# Patient Record
Sex: Female | Born: 1945 | Hispanic: Yes | Marital: Married | Smoking: Never smoker
Health system: Southern US, Community
[De-identification: ages and names within clinical notes are randomized; demographics above are authoritative.]

## PROBLEM LIST (undated history)

## (undated) DIAGNOSIS — IMO0002 Reserved for concepts with insufficient information to code with codable children: Secondary | ICD-10-CM

## (undated) DIAGNOSIS — I209 Angina pectoris, unspecified: Secondary | ICD-10-CM

## (undated) DIAGNOSIS — E119 Type 2 diabetes mellitus without complications: Secondary | ICD-10-CM

## (undated) DIAGNOSIS — Z7901 Long term (current) use of anticoagulants: Secondary | ICD-10-CM

## (undated) DIAGNOSIS — U071 COVID-19: Secondary | ICD-10-CM

## (undated) DIAGNOSIS — R112 Nausea with vomiting, unspecified: Secondary | ICD-10-CM

## (undated) DIAGNOSIS — I480 Paroxysmal atrial fibrillation: Secondary | ICD-10-CM

## (undated) DIAGNOSIS — I2510 Atherosclerotic heart disease of native coronary artery without angina pectoris: Secondary | ICD-10-CM

## (undated) DIAGNOSIS — M810 Age-related osteoporosis without current pathological fracture: Secondary | ICD-10-CM

## (undated) DIAGNOSIS — J45909 Unspecified asthma, uncomplicated: Secondary | ICD-10-CM

## (undated) DIAGNOSIS — Z9889 Other specified postprocedural states: Secondary | ICD-10-CM

## (undated) DIAGNOSIS — Z9989 Dependence on other enabling machines and devices: Secondary | ICD-10-CM

## (undated) DIAGNOSIS — E785 Hyperlipidemia, unspecified: Secondary | ICD-10-CM

## (undated) DIAGNOSIS — I10 Essential (primary) hypertension: Secondary | ICD-10-CM

## (undated) DIAGNOSIS — Z79899 Other long term (current) drug therapy: Secondary | ICD-10-CM

## (undated) DIAGNOSIS — I4891 Unspecified atrial fibrillation: Secondary | ICD-10-CM

## (undated) DIAGNOSIS — M329 Systemic lupus erythematosus, unspecified: Secondary | ICD-10-CM

## (undated) DIAGNOSIS — Z972 Presence of dental prosthetic device (complete) (partial): Secondary | ICD-10-CM

## (undated) DIAGNOSIS — E039 Hypothyroidism, unspecified: Secondary | ICD-10-CM

## (undated) DIAGNOSIS — K219 Gastro-esophageal reflux disease without esophagitis: Secondary | ICD-10-CM

## (undated) DIAGNOSIS — Z7960 Long term (current) use of unspecified immunomodulators and immunosuppressants: Secondary | ICD-10-CM

## (undated) DIAGNOSIS — I5189 Other ill-defined heart diseases: Secondary | ICD-10-CM

## (undated) HISTORY — PX: SALPINGOOPHORECTOMY: SHX82

## (undated) HISTORY — PX: CHOLECYSTECTOMY: SHX55

## (undated) HISTORY — PX: SPLENECTOMY, TOTAL: SHX788

---

## 2009-07-16 ENCOUNTER — Emergency Department: Payer: Self-pay | Admitting: Emergency Medicine

## 2010-08-12 ENCOUNTER — Emergency Department: Payer: Self-pay | Admitting: Emergency Medicine

## 2011-10-28 IMAGING — CT CT HEAD WITHOUT CONTRAST
2 series · 15 of 30 positions shown, 19 images · non-contrast
Comparison: none

REASON FOR EXAM: headache, HTN
COMMENTS:

[Series 2: without · axial · non-contrast · 0.38mm/px · z∈[+1057,+1187]mm · 13 of 32 slices shown, 17 images]
[im 3/32  brain]
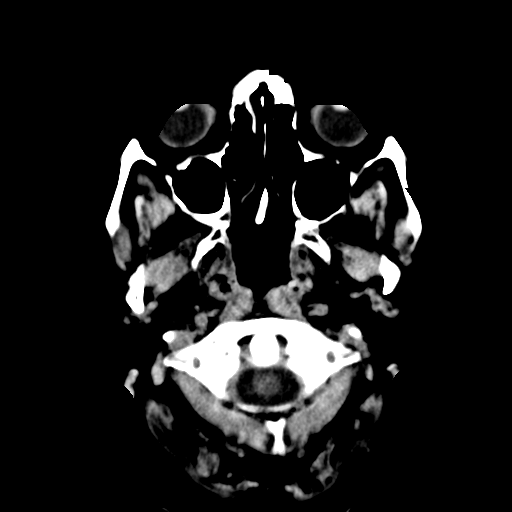
[im 3/32  bone]
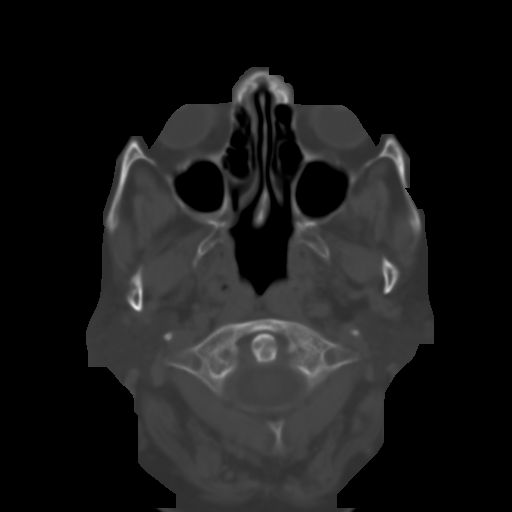
[im 5/32  brain]
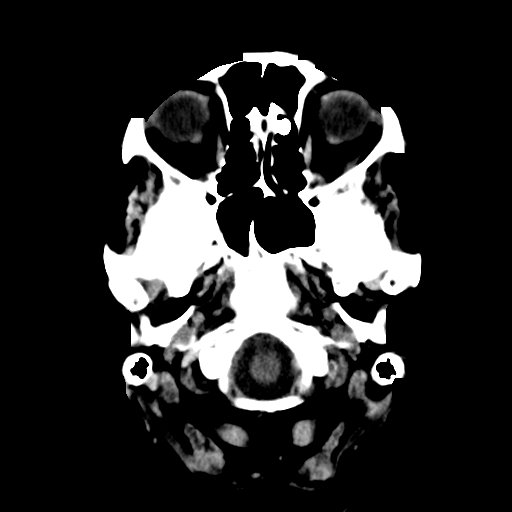
[im 7/32  brain]
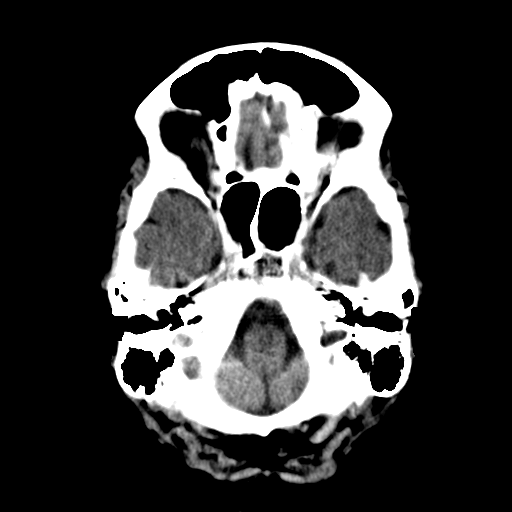
[im 9/32  brain]
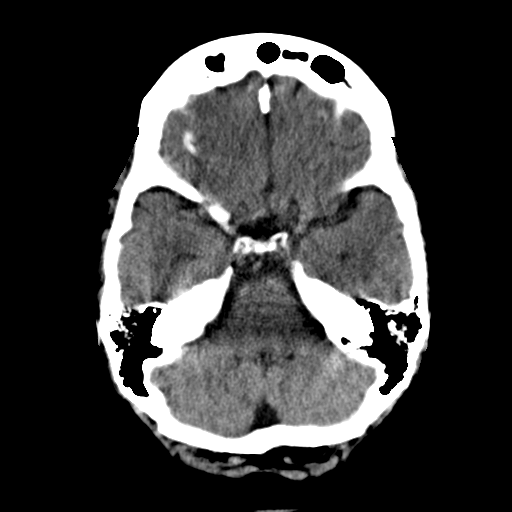
[im 12/32  brain]
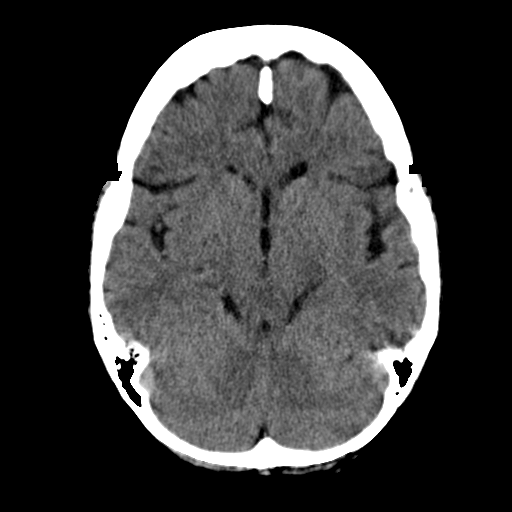
[im 12/32  bone]
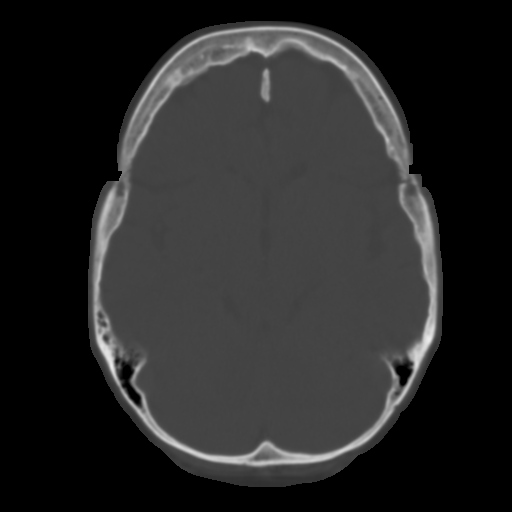
[im 14/32  brain]
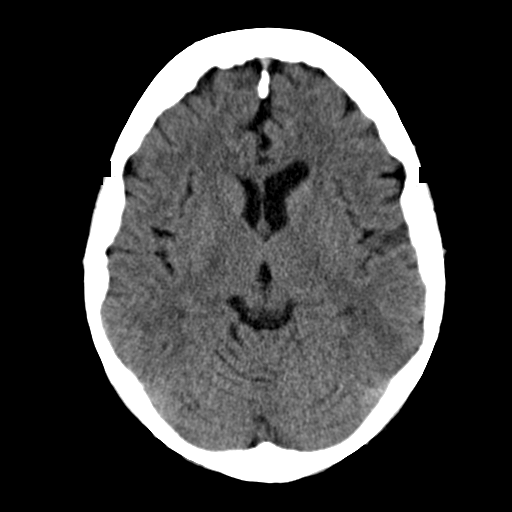
[im 16/32  brain]
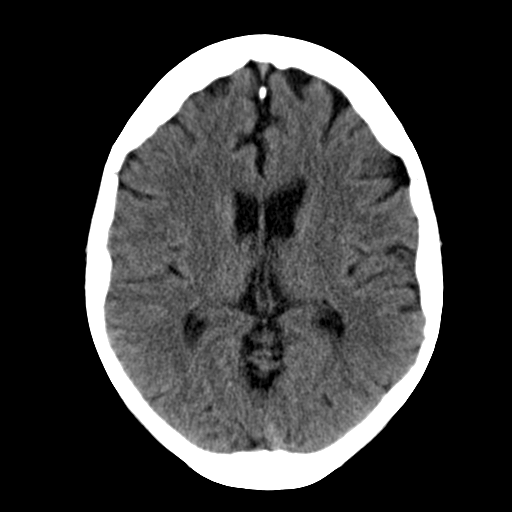
[im 18/32  brain]
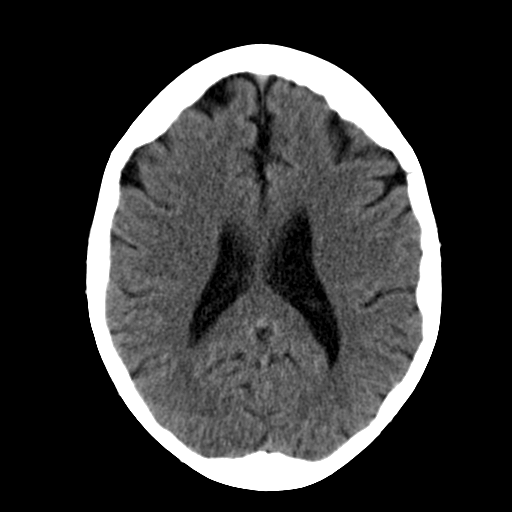
[im 20/32  brain]
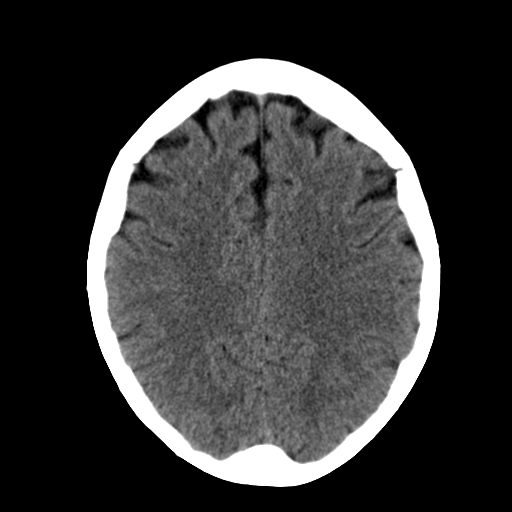
[im 20/32  bone]
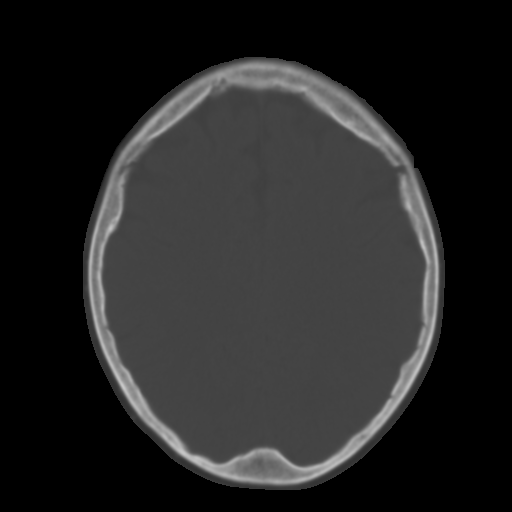
[im 23/32  brain]
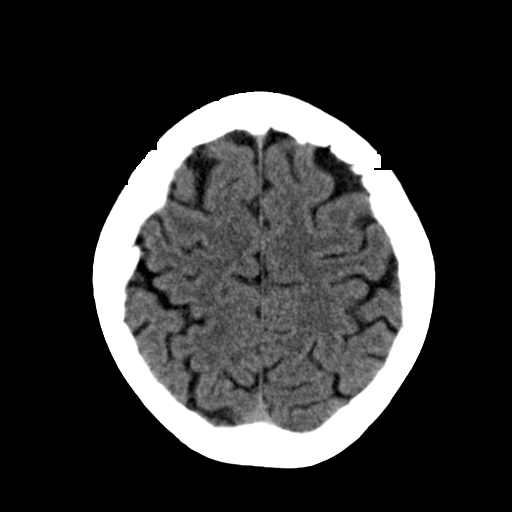
[im 25/32  brain]
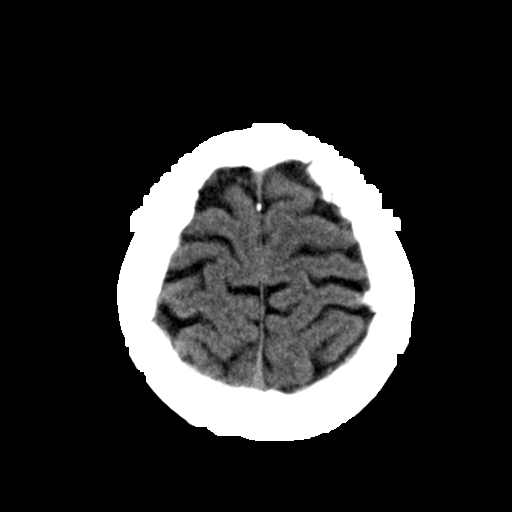
[im 27/32  brain]
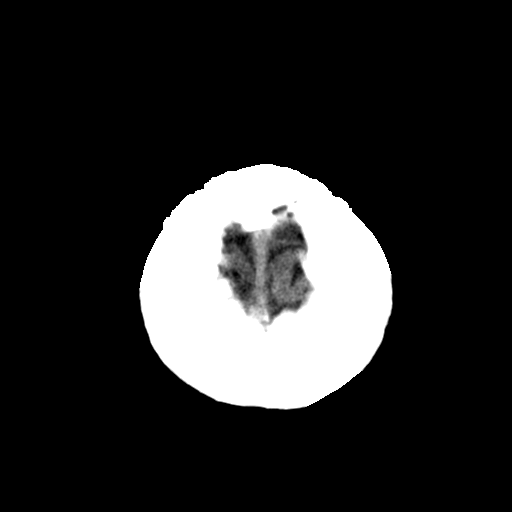
[im 29/32  brain]
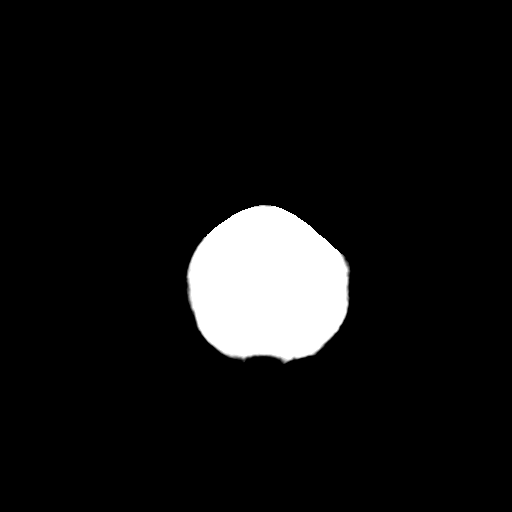
[im 29/32  bone]
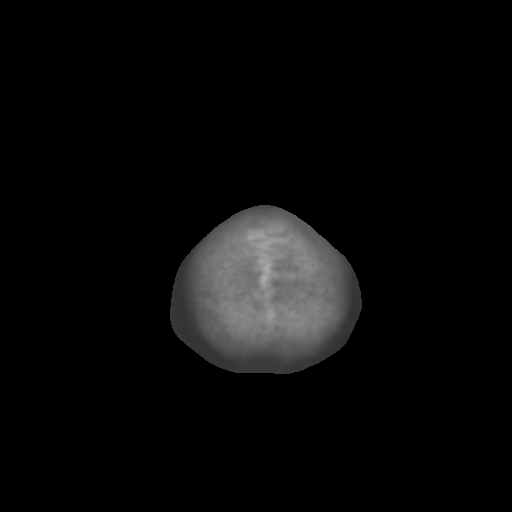

[Series 3: bone · axial · 0.38mm/px · z∈[+1057,+1077]mm · 2 of 32 slices shown]
[im 3/32  bone]
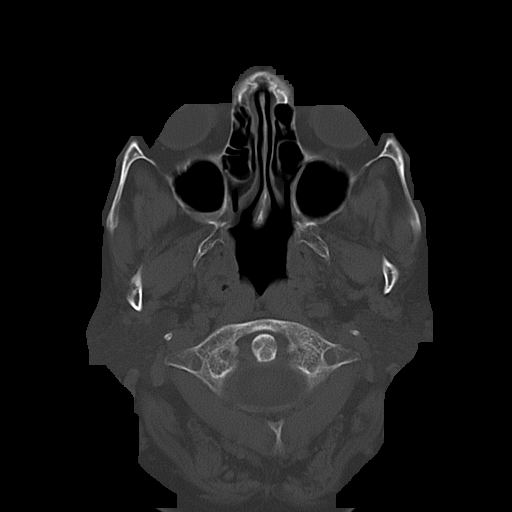
[im 7/32  bone]
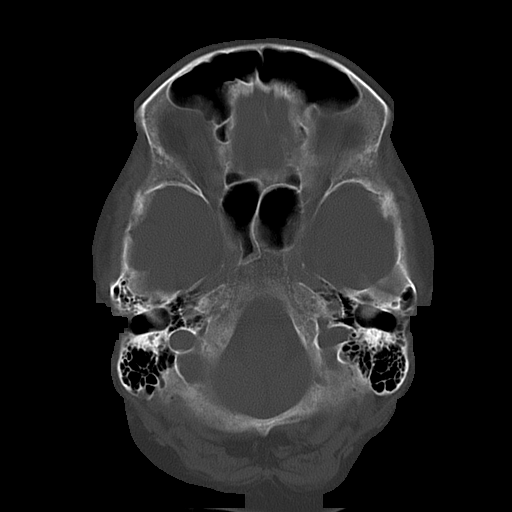

[15 of 30 positions shown; findings below may reference images not displayed]

PROCEDURE:     CT  - CT HEAD WITHOUT CONTRAST  - August 12, 2010  [DATE]

RESULT:     Axial noncontrast CT scanning was performed through the brain at
5 mm intervals and slice thicknesses.

There is mild age-appropriate diffuse cerebral atrophy. The ventricles are
normal in size and position. There is no intracranial hemorrhage nor
intracranial mass effect. There is no evidence of an evolving ischemic
infarction. The cerebellum and brainstem are normal in density.

At bone window settings there is an air-fluid level in the right maxillary
sinus. In the absence of a history of trauma, infection is felt to be
likely. I see no evidence of an acute skull fracture.
IMPRESSION: 1. I see no acute intracranial hemorrhage nor other acute abnormality of the
brain.
2. There is an air-fluid level in the right maxillary sinus which likely
reflects acute sinusitis in the absence of any history of trauma.

## 2012-05-24 HISTORY — PX: CARDIAC CATHETERIZATION: SHX172

## 2020-02-28 ENCOUNTER — Other Ambulatory Visit: Payer: Self-pay | Admitting: Family Medicine

## 2020-02-28 DIAGNOSIS — Z1231 Encounter for screening mammogram for malignant neoplasm of breast: Secondary | ICD-10-CM

## 2020-03-02 ENCOUNTER — Ambulatory Visit: Payer: Medicare HMO | Admitting: Dermatology

## 2020-03-02 ENCOUNTER — Other Ambulatory Visit: Payer: Self-pay

## 2020-03-02 DIAGNOSIS — B353 Tinea pedis: Secondary | ICD-10-CM | POA: Diagnosis not present

## 2020-03-02 DIAGNOSIS — L820 Inflamed seborrheic keratosis: Secondary | ICD-10-CM | POA: Diagnosis not present

## 2020-03-02 MED ORDER — TERBINAFINE HCL 250 MG PO TABS: 250.0000 mg | ORAL_TABLET | Freq: Every day | ORAL | 0 refills | Status: AC

## 2020-03-02 MED ORDER — CICLOPIROX OLAMINE 0.77 % EX CREA
TOPICAL_CREAM | CUTANEOUS | 1 refills | Status: DC
Start: 2020-03-02 — End: 2021-05-11

## 2020-03-02 NOTE — Progress Notes (Signed)
° °  New Patient Visit  Subjective  Janice Cook is a 74 y.o. female who presents for the following: rash (feet x 6 mos, arms - sometimes itchy and growing). She is using Lubriderm moisturizer, which helps some.  She also has a spot on her arm which is irritated and itchy.   The following portions of the chart were reviewed this encounter and updated as appropriate:      Review of Systems:  No other skin or systemic complaints except as noted in HPI or Assessment and Plan.  Objective  Well appearing patient in no apparent distress; mood and affect are within normal limits.  A focused examination was performed including feet, arms. Relevant physical exam findings are noted in the Assessment and Plan.  Objective  Plantar feet, toenails: Erythema with scale on plantar feet and webspaces with associated toenail dystrophy.  Objective  Right Forearm: Erythematous keratotic or waxy stuck-on papule    Assessment & Plan  Tinea pedis of both feet Plantar feet, toenails  Start terbinafine 250mg  take 1 po QD with food dsp #14 0Rf. Start ciclopirox cream Apply to feet and webspaces BID x 4 weeks, then use QD around toenails.  Terbinafine Counseling  Terbinafine is an anti-fungal medicine that can be applied to the skin (over the counter) or taken by mouth (prescription) to treat fungal infections. The pill version is often used to treat fungal infections of the nails or scalp. While most people do not have any side effects from taking terbinafine pills, some possible side effects of the medicine can include taste changes, headache, loss of smell, vision changes, nausea, vomiting, or diarrhea.   Rare side effects can include irritation of the liver, allergic reaction, or decrease in blood counts (which may show up as not feeling well or developing an infection). If you are concerned about any of these side effects, please stop the medicine and call your doctor, or in the case of an  emergency such as feeling very unwell, seek immediate medical care.    ciclopirox (LOPROX) 0.77 % cream - Plantar feet, toenails  terbinafine (LAMISIL) 250 MG tablet - Plantar feet, toenails  Inflamed seborrheic keratosis Right Forearm  Discussed LN2 tx vs topical hydrocortisone cream. Pt prefers LN2. Discussed risk of hypopigmentation at treatment site.  Destruction of lesion - Right Forearm  Destruction method: cryotherapy   Informed consent: discussed and consent obtained   Lesion destroyed using liquid nitrogen: Yes   Region frozen until ice ball extended beyond lesion: Yes   Outcome: patient tolerated procedure well with no complications   Post-procedure details: wound care instructions given    Return in about 6 weeks (around 04/13/2020) for recheck ISK, tinea.  I10/10/2019, CMA, am acting as scribe for Cherlyn Labella, MD .  Documentation: I have reviewed the above documentation for accuracy and completeness, and I agree with the above.  Willeen Niece MD

## 2020-03-02 NOTE — Patient Instructions (Addendum)
Cryotherapy Aftercare  . Wash gently with soap and water everyday.   Marland Kitchen Apply Vaseline and Band-Aid daily until healed.   Terbinafine Counseling  Terbinafine is an anti-fungal medicine that can be applied to the skin (over the counter) or taken by mouth (prescription) to treat fungal infections. The pill version is often used to treat fungal infections of the nails or scalp. While most people do not have any side effects from taking terbinafine pills, some possible side effects of the medicine can include taste changes, headache, loss of smell, vision changes, nausea, vomiting, or diarrhea.   Rare side effects can include irritation of the liver, allergic reaction, or decrease in blood counts (which may show up as not feeling well or developing an infection). If you are concerned about any of these side effects, please stop the medicine and call your doctor, or in the case of an emergency such as feeling very unwell, seek immediate medical care.

## 2020-03-10 ENCOUNTER — Ambulatory Visit
Admission: RE | Admit: 2020-03-10 | Discharge: 2020-03-10 | Disposition: A | Discharge status: Home / Self Care | Payer: Medicare HMO | Source: Ambulatory Visit | Attending: Family Medicine | Admitting: Family Medicine

## 2020-03-10 ENCOUNTER — Encounter: Payer: Self-pay | Admitting: Radiology

## 2020-03-10 DIAGNOSIS — Z1231 Encounter for screening mammogram for malignant neoplasm of breast: Secondary | ICD-10-CM

## 2020-03-11 ENCOUNTER — Other Ambulatory Visit (HOSPITAL_COMMUNITY): Payer: Self-pay | Admitting: Student

## 2020-03-11 ENCOUNTER — Other Ambulatory Visit: Payer: Self-pay | Admitting: Student

## 2020-03-11 DIAGNOSIS — M7541 Impingement syndrome of right shoulder: Secondary | ICD-10-CM

## 2020-03-11 DIAGNOSIS — M7581 Other shoulder lesions, right shoulder: Secondary | ICD-10-CM

## 2020-03-30 ENCOUNTER — Ambulatory Visit: Payer: Medicare HMO

## 2020-04-13 ENCOUNTER — Ambulatory Visit
Admission: RE | Admit: 2020-04-13 | Discharge: 2020-04-13 | Disposition: A | Discharge status: Home / Self Care | Payer: Medicare HMO | Source: Ambulatory Visit | Attending: Student | Admitting: Student

## 2020-04-13 ENCOUNTER — Other Ambulatory Visit: Payer: Self-pay

## 2020-04-13 DIAGNOSIS — M7581 Other shoulder lesions, right shoulder: Secondary | ICD-10-CM | POA: Diagnosis present

## 2020-04-13 DIAGNOSIS — M7541 Impingement syndrome of right shoulder: Secondary | ICD-10-CM | POA: Diagnosis present

## 2020-04-28 ENCOUNTER — Encounter: Payer: Self-pay | Admitting: Dermatology

## 2020-04-28 ENCOUNTER — Ambulatory Visit: Payer: Medicare HMO | Admitting: Dermatology

## 2020-04-28 ENCOUNTER — Other Ambulatory Visit: Payer: Self-pay

## 2020-04-28 DIAGNOSIS — B353 Tinea pedis: Secondary | ICD-10-CM | POA: Diagnosis not present

## 2020-04-28 DIAGNOSIS — L820 Inflamed seborrheic keratosis: Secondary | ICD-10-CM | POA: Diagnosis not present

## 2020-04-28 NOTE — Patient Instructions (Addendum)
Recommend daily broad spectrum sunscreen SPF 30+ to sun-exposed areas, reapply every 2 hours as needed. Call for new or changing lesions.  Can purchase the Terbinafine cream over the counter called Lamisil cream in the pharmacy apply to feet daily especially around toe nails

## 2020-04-28 NOTE — Progress Notes (Signed)
   Follow-Up Visit   Subjective  Janice Cook is a 74 y.o. female who presents for the following: Follow-up.  Patient presents today for follow up on OV 03/02/20 for Tinea Pedis and ISK. Has been taking Terbinafine 250 mg QD and ciclopirox cream BID for tinea pedis, and cryotherapy for isk R. Forearm. Patient states that ISK is better if not resolved and feet are a lot better at this time. No longer red and scaly or itchy   The following portions of the chart were reviewed this encounter and updated as appropriate:      Review of Systems:  No other skin or systemic complaints except as noted in HPI or Assessment and Plan.  Objective  Well appearing patient in no apparent distress; mood and affect are within normal limits.  A focused examination was performed including Feet. Relevant physical exam findings are noted in the Assessment and Plan.  Objective  Right Forearm - Anterior: Dyspigmented macule- no residual ISK  Objective  Right Foot - Anterior: Mild erythema, no scale, web spaces clear.   Assessment & Plan  Inflamed seborrheic keratosis Right Forearm - Anterior  Resolved with PIPA Recommend daily broad spectrum sunscreen SPF 30+ to sun-exposed areas, reapply every 2 hours as needed. Call for new or changing lesions.   Tinea pedis of both feet Right Foot - Anterior  Resolved Continue using Ciclopirox cream QD daily around toenails for prevention of recurrence May also use OTC Lamisil cream  ciclopirox (LOPROX) 0.77 % cream - Right Foot - Anterior  Return if symptoms worsen or fail to improve.  Documentation: I have reviewed the above documentation for accuracy and completeness, and I agree with the above.  Willeen Niece MD

## 2021-01-29 ENCOUNTER — Other Ambulatory Visit: Payer: Self-pay | Admitting: Student

## 2021-01-29 DIAGNOSIS — M7581 Other shoulder lesions, right shoulder: Secondary | ICD-10-CM

## 2021-01-29 DIAGNOSIS — M7521 Bicipital tendinitis, right shoulder: Secondary | ICD-10-CM

## 2021-01-29 DIAGNOSIS — M75121 Complete rotator cuff tear or rupture of right shoulder, not specified as traumatic: Secondary | ICD-10-CM

## 2021-03-09 ENCOUNTER — Ambulatory Visit
Admission: RE | Admit: 2021-03-09 | Discharge: 2021-03-09 | Disposition: A | Discharge status: Home / Self Care | Payer: Medicare HMO | Source: Ambulatory Visit | Attending: Student | Admitting: Student

## 2021-03-09 ENCOUNTER — Other Ambulatory Visit: Payer: Self-pay

## 2021-03-09 DIAGNOSIS — M7581 Other shoulder lesions, right shoulder: Secondary | ICD-10-CM | POA: Diagnosis present

## 2021-03-09 DIAGNOSIS — M75121 Complete rotator cuff tear or rupture of right shoulder, not specified as traumatic: Secondary | ICD-10-CM

## 2021-03-09 DIAGNOSIS — M7521 Bicipital tendinitis, right shoulder: Secondary | ICD-10-CM | POA: Insufficient documentation

## 2021-04-20 ENCOUNTER — Other Ambulatory Visit: Payer: Self-pay | Admitting: Surgery

## 2021-04-20 DIAGNOSIS — Z8616 Personal history of COVID-19: Secondary | ICD-10-CM

## 2021-04-20 HISTORY — DX: Personal history of COVID-19: Z86.16

## 2021-04-30 ENCOUNTER — Other Ambulatory Visit: Payer: Self-pay

## 2021-04-30 ENCOUNTER — Encounter
Admission: RE | Admit: 2021-04-30 | Discharge: 2021-04-30 | Disposition: A | Discharge status: Home / Self Care | Payer: Medicare HMO | Source: Ambulatory Visit | Attending: Surgery | Admitting: Surgery

## 2021-04-30 HISTORY — DX: COVID-19: U07.1

## 2021-04-30 HISTORY — DX: Type 2 diabetes mellitus without complications: E11.9

## 2021-04-30 HISTORY — DX: Unspecified atrial fibrillation: I48.91

## 2021-04-30 HISTORY — DX: Hyperlipidemia, unspecified: E78.5

## 2021-04-30 HISTORY — DX: Nausea with vomiting, unspecified: R11.2

## 2021-04-30 HISTORY — DX: Unspecified asthma, uncomplicated: J45.909

## 2021-04-30 HISTORY — DX: Reserved for concepts with insufficient information to code with codable children: IMO0002

## 2021-04-30 HISTORY — DX: Systemic lupus erythematosus, unspecified: M32.9

## 2021-04-30 HISTORY — DX: Other specified postprocedural states: Z98.890

## 2021-04-30 HISTORY — DX: Essential (primary) hypertension: I10

## 2021-04-30 HISTORY — DX: Atherosclerotic heart disease of native coronary artery without angina pectoris: I25.10

## 2021-04-30 NOTE — Pre-Procedure Instructions (Signed)
Patient interviewed using interpreter "C" [phone].

## 2021-04-30 NOTE — Patient Instructions (Addendum)
Your procedure is scheduled on: 05/11/21 Report to DAY SURGERY DEPARTMENT LOCATED ON 2ND FLOOR MEDICAL MALL ENTRANCE. To find out your arrival time please call [phone] between 1PM - 3PM on 05/10/21.  Remember: Instructions that are not followed completely may result in serious medical risk, up to and including death, or upon the discretion of your surgeon and anesthesiologist your surgery may need to be rescheduled.     _X__ 1. Do not eat food after midnight the night before your procedure.                 No gum chewing or hard candies. You may drink clear liquids up to 2 hours                 before you are scheduled to arrive for your surgery-                  Diabetics water only  __X__2.  On the morning of surgery brush your teeth with toothpaste and water, you                 may rinse your mouth with mouthwash if you wish.  Do not swallow any              toothpaste of mouthwash.     _X__ 3.  No Alcohol for 24 hours before or after surgery.   _X__ 4.  Do Not Smoke or use e-cigarettes For 24 Hours Prior to Your Surgery.                 Do not use any chewable tobacco products for at least 6 hours prior to                 surgery.  ____  5.  Bring all medications with you on the day of surgery if instructed.   __X__  6.  Notify your doctor if there is any change in your medical condition      (cold, fever, infections).     Do not wear jewelry, make-up, hairpins, clips or nail polish. Do not wear lotions, powders, or perfumes. NO DEODORANT Do not shave body hair 48 hours prior to surgery. Men may shave face and neck. Do not bring valuables to the hospital.  Wear loose fitting cloting  [facility] is not responsible for any belongings or valuables.  Contacts, dentures/partials or body piercings may not be worn into surgery. Bring a case for your contacts, glasses or hearing aids, a denture cup will be supplied. Leave your suitcase in the car. After surgery it may be brought to  your room. For patients admitted to the hospital, discharge time is determined by your treatment team.   Patients discharged the day of surgery will not be allowed to drive home.   Please read over the following fact sheets that you were given:   CHG soap, Incentive Spirometer  __X__ Take these medicines the morning of surgery with A SIP OF WATER:    1. amiodarone (PACERONE) 200 MG tablet  2. DULoxetine (CYMBALTA) 20 MG capsule  3. fenofibrate 160 MG tablet  4. gabapentin (NEURONTIN) 300 MG capsule  5. omeprazole (PRILOSEC) 20 MG capsule  6. predniSONE (DELTASONE) 1 MG tablet  7. rosuvastatin (CRESTOR) 40 MG tablet  8. mycophenolate (CELLCEPT) 250 MG capsule  ____ Fleet Enema (as directed)   __X__ Use CHG Soap/SAGE wipes as directed  ____ Use inhalers on the day of surgery  ____ Stop  metformin/Janumet/Farxiga 2 days prior to surgery    __X__ Take 1/2 of usual insulin dose the night before surgery. No insulin the morning          of surgery.   __X__ Stop Blood Thinners Eliquis 3 DAYS PRIOR TO SURGERY AS INSTRUCTED  __X__ Stop Anti-inflammatories 7 days before surgery such as Advil, Ibuprofen, Motrin,  BC or Goodies Powder, Naprosyn, Naproxen, Aleve, Aspirin   YOU MAY TAKE TYLENOL IF NEEDED  __X__ Stop all herbal supplements, fish oil or vitamin E until after surgery.    ____ Bring C-Pap to the hospital.      Su procedimiento est programado para: 30/8/22 [place] en el DEPARTAMENTO DE CIRUGA DE DA UBICADO EN LA ENTRADA DEL CENTRO MDICO DEL 2. PISO. Para averiguar su hora de llegada, llame al [phone] entre la 1:00 p. m. y las 3:00 p. m. el 29/08/22.  Recuerde: las instrucciones que no se siguen por completo Armed forces logistics/support/administrative officer en un riesgo mdico grave, que puede incluir la [place], o segn el criterio de su cirujano y Scientific laboratory technician, es posible que sea Aeronautical engineer su Leisure centre manager.    _X__ 1. No ingiera alimentos despus de la medianoche anterior a su  procedimiento.                 No masticar chicle ni caramelos duros. Puede beber lquidos claros hasta 2 horas                 antes de su llegada programada para su ciruga-                 Slo agua para diabticos  __X__2. En la maana de la ciruga cepllese los dientes con pasta dental y agua, [place] enjuagarse la boca con enjuague bucal si lo desea. No trague ninguna pasta de dientes o enjuague bucal.   _X__ 3. Nada de alcohol por 24 horas antes o despus de la [place].   _X__ 4. No fume ni use cigarrillos electrnicos durante las 24 horas previas a su [place].                 No use ningn producto de tabaco masticable durante al menos 6 horas antes de                 [place].  ____ 5. Traiga todos los medicamentos con usted el da de la ciruga si se lo indicaron.  __X__ 6. Notifique a su mdico si hay algn cambio en su condicin mdica (resfriado, fiebre, infecciones).   No use joyas, maquillaje, horquillas para el cabello, clips o esmalte de uas. No use lociones, talcos ni perfumes. SIN DESODORANTE No se afeite el vello corporal 48 horas antes de la [place]. Los hombres pueden [employer] cara y el cuello. No lleve objetos de valor al hospital. Use ropa holgada  [facility] no es responsable de ninguna pertenencia u objeto de Licensed conveyancer.  No se pueden usar lentes de contacto, dentaduras postizas/parciales o perforaciones corporales en la ciruga. Traiga un estuche para sus lentes de contacto, anteojos o audfonos, se le proporcionar una copa para dentadura postiza. Deja tu maleta en el coche. Despus de la [place], es posible que lo lleven a su habitacin. Para los pacientes ingresados en el hospital, la hora del alta la determina su equipo de tratamiento  Los pacientes dados de alta el da de la ciruga no podrn Science writer a Higher education careers adviser.  Por favor, lea las siguientes hojas informativas que le dieron: Jabn CHG,  espirmetro de incentivo  __X__ [employer] la maana  de la ciruga con UN SORBO DE AGUA:  1. comprimido de 200 mg de amiodarona (PACERONE) 2. Cpsula de 20 mg de DULoxetina (CYMBALTA) 3. comprimido de fenofibrato de 160 mg 4. gabapentina (NEURONTIN) cpsula de 300 MG 5. cpsula de 20 mg de omeprazol (PRILOSEC) 6. tableta de 1 MG de predniSONA (DELTASONA) 7. tableta de 40 mg de rosuvastatina (CRESTOR) 8. micofenolato (CELLCEPT) cpsula de 250 MG  ____ Fleet Enema (segn las indicaciones)  __X__ Use CHG Soap/SAGE toallitas como se indica  ____ Usar inhaladores el da de la ciruga  ____ Suspender metformina/Janumet/Farxiga 2 das antes de la ciruga  __X__ [employer] mitad de la dosis habitual de insulina la noche anterior a la Leisure centre manager. Sin insulina por la maana          de [place]  __X__ Deje de diluir la sangre Eliquis 3 DAS ANTES DE LA CIRUGA SEGN LAS INSTRUCCIONES  __X__ Suspender los antiinflamatorios 7 das antes de la ciruga como Advil, Ibuprofen, Motrin, BC o Goodies Powder, Naprosyn, Naproxen, Aleve, Aspirin PUEDE TOMAR TYLENOL SI ES NECESARIO  __X__ Suspenda todos los suplementos de hierbas, aceite de pescado o vitamina E hasta despus de la [place].  ____ [facility] al hospital.

## 2021-05-06 ENCOUNTER — Encounter: Payer: Self-pay | Admitting: Surgery

## 2021-05-06 NOTE — Progress Notes (Signed)
Perioperative Services  Pre-Admission/Anesthesia Testing Clinical Review  Date: 05/06/21  Patient Demographics:  Name: Janice Cook DOB:   February 23, 1946 MRN:   [id number]  Planned Surgical Procedure(s):    Case: [id number] Date/Time: 05/11/21 1303   Procedure: SHOULDER ARTHROSCOPY WITH DEBRIDEMENT, DECOMPRESSION, ROTATOR CUFF REPAIR, BICEPS TENODESIS AND DISTAL CLAVICLE EXCISION. 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 Clinical nursing staff has updated patient's PMH/PSHx, current medication list, and drug allergies/intolerances to ensure comprehensive history available to assist in medical decision making as it pertains to the aforementioned surgical procedure and anticipated anesthetic course. Extensive review of available clinical information performed. [place] PMH and PSHx updated with any diagnoses/procedures that  may have been inadvertently omitted during her intake with the pre-admission testing department's nursing staff.  Clinical Discussion:  Janice Cook is a 75 y.o. female who is submitted for pre-surgical anesthesia review and clearance prior to her undergoing the above procedure. Patient has never been a smoker. Pertinent PMH includes: CAD, paroxysmal atrial fibrillation, diastolic dysfunction (S9QZ), angina, HTN, HLD, T2DM, asthma, GERD (on daily PPI), SLE, osteoporosis, long-term immunosuppressive therapy (MTX and mycophenolate).  Patient is followed by cardiology Nehemiah Massed, MD). She was last seen in  the cardiology clinic on 04/08/2021; notes reviewed.  At the time of her clinic visit, patient doing well overall from a cardiovascular perspective.  She denied any episodes of chest pain, shortness breath, PND, orthopnea, significant peripheral edema, vertiginous symptoms, or presyncope/syncope.  Patient with intermittent palpitations associated with her known underlying cardiovascular diagnoses.  Patient underwent a diagnostic left heart catheterization on 05/24/2012 that revealed a LEFT dominant coronary artery system.  LVEF was estimated at 60%.  There was no obstructive CAD noted.  LVEDP 16.  TTE was performed on 02/17/2020 revealing globally normal systolic heart function.  There was mild to moderate valvular insufficiency noted.  No evidence of valvular stenosis.  Repeat study on 10/07/2020 remain grossly unchanged.  Myocardial perfusion imaging study performed on 10/07/2020 demonstrated an LVEF of 58%.  There was no evidence of stress-induced myocardial ischemia or arrhythmia.  PMH significant for paroxysmal atrial fibrillation. CHA2DS2-VASc Score = 4 (age, sex, HTN, T2DM).  Patient is chronically anticoagulated with daily apixaban dose; compliant with therapy with no evidence of GI bleeding.  Rate and rhythm controlled with daily 200 mg dose of amiodarone.  Blood pressure well controlled at 120/78 on currently prescribed diuretic and ARB therapies.  Patient is on fenofibrate therapy for her HLD.  T2DM well controlled per patient report; no recent Hgb A1c available for review. Functional capacity, as defined by DASI, is documented as being >/= 4 METS.  No changes were made to her medication regimen.  Patient to follow-up with outpatient cardiology in 6 months or sooner if needed.  Patient is scheduled to undergo an elective RIGHT shoulder arthroscopy, rotator cuff repair, biceps tenodesis, and distal clavicle excision on 05/11/2021 with Dr. Milagros Evener, MD.  Given patient's past medical  history  significant for cardiovascular disease and intervention, presurgical cardiac clearance was sought by the performing surgeon's office and PAT team. "The patient is at the lowest risk possible for perioperative cardiovascular complications with the planned procedure.  The overall risk her procedure is low (<1%).  Currently has no evidence active and/or significant angina and/or congestive heart failure. Patient may proceed to surgery without restriction or need for further cardiovascular testing and an overall LOW risk".  Again, this patient is on daily anticoagulation therapy.  She has been instructed on recommendations for holding her apixaban for 3 days prior to her procedure with plans to restart as soon as postoperative bleeding risk felt to be minimized by the primary attending surgeon.  The patient is aware that her last dose of apixaban will be on 05/07/2021.  Patient denies previous perioperative complications with anesthesia in the past.  In review of the EMR, there are no available records for review regarding patient's past procedural/anesthetic courses within the [facility] system.  Vitals with BMI 04/30/2021  Height '4\' 9"'   Weight 149 lbs  BMI 32.23    Providers/Specialists:   NOTE: Primary physician provider listed below. Patient may have been seen by APP or partner within same practice.   PROVIDER ROLE / SPECIALTY LAST OV  Poggi, Marshall Cork, MD Orthopedics (Surgeon) 04/19/2021  Letta Median, MD Primary Care Provider ???  Serafina Royals, MD Cardiology 04/08/2021  Ephriam Jenkins, MD Rheumatology 03/18/2021   Allergies:  Aspirin, Nitroglycerin, Other, Sulfa antibiotics, Propofol, and Latex  Current Home Medications:   No current facility-administered medications for this encounter.    alendronate (FOSAMAX) 70 MG tablet   amiodarone (PACERONE) 200 MG tablet   apixaban (ELIQUIS) 5 MG TABS tablet   diclofenac Sodium (VOLTAREN) 1 % GEL   fenofibrate 160 MG tablet    fluticasone (FLONASE) 50 MCG/ACT nasal spray   folic acid (FOLVITE) 1 MG tablet   furosemide (LASIX) 20 MG tablet   gabapentin (NEURONTIN) 300 MG capsule   insulin glargine (LANTUS) 100 UNIT/ML Solostar Pen   meloxicam (MOBIC) 15 MG tablet   methotrexate (RHEUMATREX) 2.5 MG tablet   montelukast (SINGULAIR) 10 MG tablet   mycophenolate (CELLCEPT) 250 MG capsule   omeprazole (PRILOSEC) 20 MG capsule   potassium chloride SA (KLOR-CON) 20 MEQ tablet   predniSONE (DELTASONE) 1 MG tablet   rosuvastatin (CRESTOR) 40 MG tablet   valsartan (DIOVAN) 80 MG tablet   ciclopirox (LOPROX) 0.77 % cream   DULoxetine (CYMBALTA) 20 MG capsule   History:   Past Medical History:  Diagnosis Date   Anginal pain (HCC)    Asthma    Chronic anticoagulation    Apixaban   Coronary artery disease    GERD (gastroesophageal reflux disease)    Grade I diastolic dysfunction    History of 2019 novel coronavirus disease (COVID-19) 04/20/2021   Tested at [facility]; retested as (+) on 04/30/2021   HLD (hyperlipidemia)    Hypertension    Long term current use of immunosuppressive drug    MTX and mycophenolate; managed by rheumatology for Dx of SLE   Lupus ([place])    Osteoporosis    PAF (paroxysmal atrial fibrillation) (HCC)    PONV (postoperative nausea and vomiting)    T2DM (type 2 diabetes mellitus) ([place])    Past Surgical History:  Procedure Laterality Date   CARDIAC CATHETERIZATION Left 05/24/2012   Normal coronaries; LVEF 60%; LVEDP 16; Location: [facility]; Surgeon: Thayer Dallas, MD   CHOLECYSTECTOMY  SALPINGOOPHORECTOMY Left    age 75 in [place]   SPLENECTOMY, TOTAL     Family History  Problem Relation Age of Onset   Breast cancer Sister 64   Breast cancer Maternal Aunt 60   Breast cancer Cousin 60   Social History   Tobacco Use   Smoking status: Never   Smokeless tobacco: Never  Vaping Use   Vaping Use: Never used  Substance Use Topics   Alcohol use: Never   Drug use: Never     Pertinent Clinical Results:  LABS: Labs reviewed: Acceptable for surgery.   Ref Range & Units 03/18/2021  WBC (White Blood Cell Count) 4.1 - 10.2 10^3/uL 14.2 High    RBC (Red Blood Cell Count) 4.04 - 5.48 10^6/uL 4.56   Hemoglobin 12.0 - 15.0 gm/dL 13.0   Hematocrit 35.0 - 47.0 % 40.5   MCV (Mean Corpuscular Volume) 80.0 - 100.0 fl 88.8   MCH (Mean Corpuscular Hemoglobin) 27.0 - 31.2 pg 28.5   MCHC (Mean Corpuscular Hemoglobin Concentration) 32.0 - 36.0 gm/dL 32.1   Platelet Count 150 - 450 10^3/uL 348   RDW-CV (Red Cell Distribution Width) 11.6 - 14.8 % 14.4   MPV (Mean Platelet Volume) 9.4 - 12.4 fl 10.9   Neutrophils 1.50 - 7.80 10^3/uL 10.88 High    Lymphocytes 1.00 - 3.60 10^3/uL 2.39   Monocytes 0.00 - 1.50 10^3/uL 0.74   Eosinophils 0.00 - 0.55 10^3/uL 0.07   Basophils 0.00 - 0.09 10^3/uL 0.09   Neutrophil % 32.0 - 70.0 % 76.6 High    Lymphocyte % 10.0 - 50.0 % 16.8   Monocyte % 4.0 - 13.0 % 5.2   Eosinophil % 1.0 - 5.0 % 0.5 Low    Basophil% 0.0 - 2.0 % 0.6   Immature Granulocyte % <=0.7 % 0.3   Immature Granulocyte Count <=0.06 10^3/L 0.04   Resulting Agency  [facility]  Specimen Collected: 03/18/21 10:57 Last Resulted: 03/18/21 11:51  Received From: [facility]  Result Received: 04/20/21 16:48    Ref Range & Units 03/18/2021  Glucose 70 - 110 mg/dL 102   Sodium 136 - 145 mmol/L 139   Potassium 3.6 - 5.1 mmol/L 4.2   Chloride 97 - 109 mmol/L 102   Carbon Dioxide (CO2) 22.0 - 32.0 mmol/L 30.8   Urea Nitrogen (BUN) 7 - 25 mg/dL 19   Creatinine 0.6 - 1.1 mg/dL 1.0   Glomerular Filtration Rate (eGFR), MDRD Estimate >60 mL/min/1.73sq m 54 Low    Calcium 8.7 - 10.3 mg/dL 9.1   AST  8 - 39 U/L 16   ALT  5 - 38 U/L 7   Alk Phos (alkaline Phosphatase) 34 - 104 U/L 52   Albumin 3.5 - 4.8 g/dL 3.8   Bilirubin, Total 0.3 - 1.2 mg/dL 0.4   Protein, Total 6.1 - 7.9 g/dL 6.9   A/G Ratio 1.0 - 5.0 gm/dL 1.2   Resulting Agency  [facility]  Specimen Collected: 03/18/21 10:57 Last Resulted: 03/18/21 14:39  Received From: [facility]  Result Received: 04/20/21 16:48   ECG: Date: 03/10/2021 Rate: 82 bpm Rhythm:  Sinus rhythm with PACs; moderate voltage criteria for LVH Intervals: PR 158 ms. QRS 66 ms. QTc 436 ms. ST segment and T wave changes: No evidence of acute ST segment elevation or depression Comparison: Similar to previous tracing obtained on 08/19/2020; however PACs now present. NOTE: Tracing obtained at [facility]; unable for review.  Above based on cardiologist's interpretation.   IMAGING / PROCEDURES: LEXISCAN performed on 10/07/2020 LVEF 58% Normal myocardial thickening and wall motion No artifacts noted Left ventricular cavity size normal No evidence of stress-induced myocardial ischemia or arrhythmia  TRANSTHORACIC ECHOCARDIOGRAM performed on 10/07/2020 LVEF >55% Normal left ventricular systolic function Normal right ventricular systolic function Diastolic parameters consistent with abnormal laxation (G1DD) Moderate MR Mild TR Trivial PR No AR No valvular stenosis No evidence of a pericardial effusion  LEFT HEART CATHETERIZATION AND CORONARY ANGIOGRAPHY performed on 05/24/2012 Coronaries are free of obstructive disease Dominant LEFT coronary artery system LVEDP 16 LVEF 60%  Impression and Plan:  Janice Cook has been referred for pre-anesthesia review and clearance prior to her undergoing the planned anesthetic and procedural courses. Available labs, pertinent testing, and imaging results were personally reviewed by me. This patient has been appropriately cleared by cardiology with an overall LOW risk of significant perioperative cardiovascular complications.  Based on clinical review performed today (05/06/21), barring any significant acute changes in the patient's overall condition, it is anticipated that she will be able to proceed with the planned  surgical intervention. Any acute changes in clinical condition may necessitate her procedure being postponed and/or cancelled. Patient will meet with anesthesia team (MD and/or CRNA) on the day of her procedure for preoperative evaluation/assessment. Questions regarding anesthetic course will be fielded at that time.   Pre-surgical instructions were reviewed with the patient during her PAT appointment and questions were fielded by PAT clinical staff. Patient was advised that if any questions or concerns arise prior to her procedure then she should return a call to PAT and/or her surgeon's office to discuss.  Honor Loh, MSN, APRN, FNP-C, CEN [facility]  Peri-operative Services Nurse Practitioner Phone: [phone] Fax: [phone] 05/06/21 10:10 AM  NOTE: This note has been prepared using Dragon dictation software. Despite my best ability to proofread, there is always the potential that unintentional transcriptional errors may still occur from this process.

## 2021-05-10 MED ORDER — CHLORHEXIDINE GLUCONATE 0.12 % MT SOLN: 15.0000 mL | Freq: Once | OROMUCOSAL | Status: AC

## 2021-05-10 MED ORDER — ORAL CARE MOUTH RINSE: 15.0000 mL | Freq: Once | OROMUCOSAL | Status: AC

## 2021-05-10 MED ORDER — CEFAZOLIN SODIUM-DEXTROSE 2-4 GM/100ML-% IV SOLN
2.0000 g | INTRAVENOUS | Status: AC
  Administered 2021-05-11: 2 g via INTRAVENOUS

## 2021-05-10 MED ORDER — APREPITANT 40 MG PO CAPS: 40.0000 mg | ORAL_CAPSULE | Freq: Once | ORAL | Status: AC

## 2021-05-10 MED ORDER — SODIUM CHLORIDE 0.9 % IV SOLN: INTRAVENOUS | Status: DC

## 2021-05-11 ENCOUNTER — Encounter
Admission: RE | Disposition: A | Discharge status: Home / Self Care | Payer: Self-pay | Source: Home / Self Care | Attending: Surgery

## 2021-05-11 ENCOUNTER — Other Ambulatory Visit: Payer: Self-pay

## 2021-05-11 ENCOUNTER — Ambulatory Visit: Payer: Medicare HMO | Admitting: Urgent Care

## 2021-05-11 ENCOUNTER — Ambulatory Visit: Payer: Medicare HMO

## 2021-05-11 ENCOUNTER — Ambulatory Visit
Admission: RE | Admit: 2021-05-11 | Discharge: 2021-05-11 | Disposition: A | Discharge status: Home / Self Care | Payer: Medicare HMO | Attending: Surgery | Admitting: Surgery

## 2021-05-11 ENCOUNTER — Encounter: Payer: Self-pay | Admitting: Surgery

## 2021-05-11 DIAGNOSIS — I480 Paroxysmal atrial fibrillation: Secondary | ICD-10-CM | POA: Insufficient documentation

## 2021-05-11 DIAGNOSIS — Z8616 Personal history of COVID-19: Secondary | ICD-10-CM | POA: Diagnosis not present

## 2021-05-11 DIAGNOSIS — E785 Hyperlipidemia, unspecified: Secondary | ICD-10-CM | POA: Insufficient documentation

## 2021-05-11 DIAGNOSIS — I2510 Atherosclerotic heart disease of native coronary artery without angina pectoris: Secondary | ICD-10-CM | POA: Insufficient documentation

## 2021-05-11 DIAGNOSIS — Z79899 Other long term (current) drug therapy: Secondary | ICD-10-CM | POA: Diagnosis not present

## 2021-05-11 DIAGNOSIS — Z7901 Long term (current) use of anticoagulants: Secondary | ICD-10-CM | POA: Diagnosis not present

## 2021-05-11 DIAGNOSIS — M329 Systemic lupus erythematosus, unspecified: Secondary | ICD-10-CM | POA: Diagnosis not present

## 2021-05-11 DIAGNOSIS — M779 Enthesopathy, unspecified: Secondary | ICD-10-CM | POA: Insufficient documentation

## 2021-05-11 DIAGNOSIS — E119 Type 2 diabetes mellitus without complications: Secondary | ICD-10-CM | POA: Diagnosis not present

## 2021-05-11 DIAGNOSIS — Z419 Encounter for procedure for purposes other than remedying health state, unspecified: Secondary | ICD-10-CM

## 2021-05-11 DIAGNOSIS — Z888 Allergy status to other drugs, medicaments and biological substances status: Secondary | ICD-10-CM | POA: Insufficient documentation

## 2021-05-11 DIAGNOSIS — Z886 Allergy status to analgesic agent status: Secondary | ICD-10-CM | POA: Insufficient documentation

## 2021-05-11 DIAGNOSIS — I10 Essential (primary) hypertension: Secondary | ICD-10-CM | POA: Diagnosis not present

## 2021-05-11 DIAGNOSIS — Z794 Long term (current) use of insulin: Secondary | ICD-10-CM | POA: Diagnosis not present

## 2021-05-11 DIAGNOSIS — Z91040 Latex allergy status: Secondary | ICD-10-CM | POA: Diagnosis not present

## 2021-05-11 DIAGNOSIS — M810 Age-related osteoporosis without current pathological fracture: Secondary | ICD-10-CM | POA: Diagnosis not present

## 2021-05-11 DIAGNOSIS — Z882 Allergy status to sulfonamides status: Secondary | ICD-10-CM | POA: Insufficient documentation

## 2021-05-11 DIAGNOSIS — M75121 Complete rotator cuff tear or rupture of right shoulder, not specified as traumatic: Secondary | ICD-10-CM | POA: Insufficient documentation

## 2021-05-11 DIAGNOSIS — M19011 Primary osteoarthritis, right shoulder: Secondary | ICD-10-CM | POA: Insufficient documentation

## 2021-05-11 DIAGNOSIS — K219 Gastro-esophageal reflux disease without esophagitis: Secondary | ICD-10-CM | POA: Insufficient documentation

## 2021-05-11 HISTORY — DX: Gastro-esophageal reflux disease without esophagitis: K21.9

## 2021-05-11 HISTORY — DX: Type 2 diabetes mellitus without complications: E11.9

## 2021-05-11 HISTORY — DX: Paroxysmal atrial fibrillation: I48.0

## 2021-05-11 HISTORY — DX: Angina pectoris, unspecified: I20.9

## 2021-05-11 HISTORY — DX: Other ill-defined heart diseases: I51.89

## 2021-05-11 HISTORY — DX: Long term (current) use of unspecified immunomodulators and immunosuppressants: Z79.60

## 2021-05-11 HISTORY — DX: Age-related osteoporosis without current pathological fracture: M81.0

## 2021-05-11 HISTORY — DX: Long term (current) use of anticoagulants: Z79.01

## 2021-05-11 HISTORY — PX: SHOULDER ARTHROSCOPY WITH SUBACROMIAL DECOMPRESSION, ROTATOR CUFF REPAIR AND BICEP TENDON REPAIR: SHX5687

## 2021-05-11 HISTORY — DX: Other long term (current) drug therapy: Z79.899

## 2021-05-11 LAB — GLUCOSE, CAPILLARY
Glucose-Capillary: 83 mg/dL (ref 70–99)
Glucose-Capillary: 146 mg/dL — ABNORMAL HIGH (ref 70–99)

## 2021-05-11 SURGERY — SHOULDER ARTHROSCOPY WITH SUBACROMIAL DECOMPRESSION, ROTATOR CUFF REPAIR AND BICEP TENDON REPAIR
Anesthesia: General | Site: Shoulder | Laterality: Right

## 2021-05-11 MED ORDER — MIDAZOLAM HCL 2 MG/2ML IJ SOLN: 1.0000 mg | INTRAMUSCULAR | Status: DC | PRN

## 2021-05-11 MED ORDER — ONDANSETRON HCL 4 MG/2ML IJ SOLN: 4.0000 mg | Freq: Once | INTRAMUSCULAR | Status: DC | PRN

## 2021-05-11 MED ORDER — ACETAMINOPHEN 10 MG/ML IV SOLN
INTRAVENOUS | Status: DC | PRN
  Administered 2021-05-11: 1000 mg via INTRAVENOUS

## 2021-05-11 MED ORDER — EPINEPHRINE PF 1 MG/ML IJ SOLN
INTRAMUSCULAR | Status: AC
  Filled 2021-05-11: qty 1

## 2021-05-11 MED ORDER — FENTANYL CITRATE PF 50 MCG/ML IJ SOSY: 50.0000 ug | PREFILLED_SYRINGE | INTRAMUSCULAR | Status: DC | PRN

## 2021-05-11 MED ORDER — FENTANYL CITRATE (PF) 100 MCG/2ML IJ SOLN: 25.0000 ug | INTRAMUSCULAR | Status: DC | PRN

## 2021-05-11 MED ORDER — ROCURONIUM BROMIDE 100 MG/10ML IV SOLN
INTRAVENOUS | Status: DC | PRN
  Administered 2021-05-11: 50 mg via INTRAVENOUS

## 2021-05-11 MED ORDER — DEXAMETHASONE SODIUM PHOSPHATE 10 MG/ML IJ SOLN
INTRAMUSCULAR | Status: DC | PRN
  Administered 2021-05-11: 5 mg via INTRAVENOUS

## 2021-05-11 MED ORDER — OXYCODONE HCL 5 MG PO TABS: 5.0000 mg | ORAL_TABLET | ORAL | 0 refills | Status: DC | PRN

## 2021-05-11 MED ORDER — ACETAMINOPHEN 10 MG/ML IV SOLN
INTRAVENOUS | Status: AC
  Filled 2021-05-11: qty 100

## 2021-05-11 MED ORDER — SODIUM CHLORIDE 0.9 % IV SOLN
INTRAVENOUS | Status: DC | PRN
  Administered 2021-05-11: 20 ug/min via INTRAVENOUS

## 2021-05-11 MED ORDER — SUGAMMADEX SODIUM 200 MG/2ML IV SOLN
INTRAVENOUS | Status: DC | PRN
  Administered 2021-05-11: 100 mg via INTRAVENOUS
  Administered 2021-05-11: 200 mg via INTRAVENOUS

## 2021-05-11 MED ORDER — OXYCODONE HCL 5 MG PO TABS: 5.0000 mg | ORAL_TABLET | ORAL | 0 refills | Status: AC | PRN

## 2021-05-11 MED ORDER — LIDOCAINE HCL (PF) 1 % IJ SOLN
INTRAMUSCULAR | Status: AC
  Filled 2021-05-11: qty 5

## 2021-05-11 MED ORDER — ONDANSETRON HCL 4 MG/2ML IJ SOLN
INTRAMUSCULAR | Status: DC | PRN
  Administered 2021-05-11: 4 mg via INTRAVENOUS

## 2021-05-11 MED ORDER — BUPIVACAINE LIPOSOME 1.3 % IJ SUSP
INTRAMUSCULAR | Status: AC
  Filled 2021-05-11: qty 20

## 2021-05-11 MED ORDER — CHLORHEXIDINE GLUCONATE 0.12 % MT SOLN
OROMUCOSAL | Status: AC
  Administered 2021-05-11: 15 mL via OROMUCOSAL
  Filled 2021-05-11: qty 15

## 2021-05-11 MED ORDER — FENTANYL CITRATE (PF) 100 MCG/2ML IJ SOLN
INTRAMUSCULAR | Status: AC
  Filled 2021-05-11: qty 2

## 2021-05-11 MED ORDER — BUPIVACAINE HCL (PF) 0.5 % IJ SOLN
INTRAMUSCULAR | Status: AC
  Filled 2021-05-11: qty 10

## 2021-05-11 MED ORDER — LIDOCAINE HCL (PF) 2 % IJ SOLN
INTRAMUSCULAR | Status: AC
  Filled 2021-05-11: qty 5

## 2021-05-11 MED ORDER — PROPOFOL 10 MG/ML IV BOLUS
INTRAVENOUS | Status: DC | PRN
  Administered 2021-05-11: 100 mg via INTRAVENOUS

## 2021-05-11 MED ORDER — ROCURONIUM BROMIDE 10 MG/ML (PF) SYRINGE
PREFILLED_SYRINGE | INTRAVENOUS | Status: AC
  Filled 2021-05-11: qty 10

## 2021-05-11 MED ORDER — BUPIVACAINE-EPINEPHRINE 0.5% -1:200000 IJ SOLN
INTRAMUSCULAR | Status: DC | PRN
  Administered 2021-05-11: 30 mL

## 2021-05-11 MED ORDER — LACTATED RINGERS IR SOLN
Status: DC | PRN
  Administered 2021-05-11: 3000 mL

## 2021-05-11 MED ORDER — PHENYLEPHRINE HCL (PRESSORS) 10 MG/ML IV SOLN
INTRAVENOUS | Status: DC | PRN
  Administered 2021-05-11: 100 ug via INTRAVENOUS
  Administered 2021-05-11 (×2): 50 ug via INTRAVENOUS
  Administered 2021-05-11: 100 ug via INTRAVENOUS

## 2021-05-11 MED ORDER — BUPIVACAINE-EPINEPHRINE (PF) 0.5% -1:200000 IJ SOLN
INTRAMUSCULAR | Status: AC
  Filled 2021-05-11: qty 30

## 2021-05-11 MED ORDER — CEFAZOLIN SODIUM-DEXTROSE 2-4 GM/100ML-% IV SOLN
INTRAVENOUS | Status: AC
  Filled 2021-05-11: qty 100

## 2021-05-11 MED ORDER — APREPITANT 40 MG PO CAPS
ORAL_CAPSULE | ORAL | Status: AC
  Administered 2021-05-11: 40 mg via ORAL
  Filled 2021-05-11: qty 1

## 2021-05-11 MED ORDER — FENTANYL CITRATE (PF) 100 MCG/2ML IJ SOLN
INTRAMUSCULAR | Status: DC | PRN
  Administered 2021-05-11: 50 ug via INTRAVENOUS

## 2021-05-11 MED ORDER — MIDAZOLAM HCL 2 MG/2ML IJ SOLN
INTRAMUSCULAR | Status: AC
  Administered 2021-05-11: 1 mg via INTRAVENOUS
  Filled 2021-05-11: qty 2

## 2021-05-11 MED ORDER — FENTANYL CITRATE PF 50 MCG/ML IJ SOSY
PREFILLED_SYRINGE | INTRAMUSCULAR | Status: AC
  Administered 2021-05-11: 50 ug via INTRAVENOUS
  Filled 2021-05-11: qty 1

## 2021-05-11 MED ORDER — LIDOCAINE HCL (CARDIAC) PF 100 MG/5ML IV SOSY
PREFILLED_SYRINGE | INTRAVENOUS | Status: DC | PRN
  Administered 2021-05-11: 80 mg via INTRAVENOUS

## 2021-05-11 MED ORDER — PROPOFOL 10 MG/ML IV BOLUS
INTRAVENOUS | Status: AC
  Filled 2021-05-11: qty 20

## 2021-05-11 MED ORDER — GLYCOPYRROLATE 0.2 MG/ML IJ SOLN
INTRAMUSCULAR | Status: DC | PRN
  Administered 2021-05-11: .1 mg via INTRAVENOUS

## 2021-05-11 MED ORDER — LACTATED RINGERS IV SOLN
INTRAVENOUS | Status: DC | PRN
  Administered 2021-05-11: 1 mL

## 2021-05-11 SURGICAL SUPPLY — 53 items
ANCHOR BONE REGENETEN (Anchor) ×2 IMPLANT
ANCHOR HEALICOIL REGEN 5.5 (Anchor) ×2 IMPLANT
ANCHOR JUGGERKNOT WTAP NDL 2.9 (Anchor) ×2 IMPLANT
ANCHOR SUT W/ ORTHOCORD (Anchor) ×2 IMPLANT
ANCHOR TENDON REGENETEN (Staple) ×2 IMPLANT
BIT DRILL JUGRKNT W/NDL BIT2.9 (DRILL) ×1 IMPLANT
BLADE FULL RADIUS 3.5 (BLADE) ×2 IMPLANT
BUR ACROMIONIZER 4.0 (BURR) ×2 IMPLANT
CANNULA SHAVER 8MMX76MM (CANNULA) ×2 IMPLANT
CHLORAPREP W/TINT 26 (MISCELLANEOUS) ×2 IMPLANT
COVER MAYO STAND REUSABLE (DRAPES) ×2 IMPLANT
DILATOR 5.5 THREADED HEALICOIL (MISCELLANEOUS) ×2 IMPLANT
DRAPE IMP U-DRAPE 54X76 (DRAPES) ×4 IMPLANT
DRILL JUGGERKNOT W/NDL BIT 2.9 (DRILL) ×2
ELECT CAUTERY BLADE 6.4 (BLADE) ×2 IMPLANT
ELECT REM PT RETURN 9FT ADLT (ELECTROSURGICAL) ×2
ELECTRODE REM PT RTRN 9FT ADLT (ELECTROSURGICAL) ×1 IMPLANT
GAUZE SPONGE 4X4 12PLY STRL (GAUZE/BANDAGES/DRESSINGS) ×2 IMPLANT
GAUZE XEROFORM 1X8 LF (GAUZE/BANDAGES/DRESSINGS) ×2 IMPLANT
GLOVE SRG 8 PF TXTR STRL LF DI (GLOVE) ×1 IMPLANT
GLOVE SURG ENC MOIS LTX SZ7.5 (GLOVE) ×4 IMPLANT
GLOVE SURG ENC MOIS LTX SZ8 (GLOVE) ×4 IMPLANT
GLOVE SURG UNDER LTX SZ8 (GLOVE) ×2 IMPLANT
GLOVE SURG UNDER POLY LF SZ8 (GLOVE) ×1
GOWN STRL REUS W/ TWL LRG LVL3 (GOWN DISPOSABLE) ×1 IMPLANT
GOWN STRL REUS W/ TWL XL LVL3 (GOWN DISPOSABLE) ×1 IMPLANT
GOWN STRL REUS W/TWL LRG LVL3 (GOWN DISPOSABLE) ×1
GOWN STRL REUS W/TWL XL LVL3 (GOWN DISPOSABLE) ×1
GRASPER SUT 15 45D LOW PRO (SUTURE) ×2 IMPLANT
IMPL REGENETEN MEDIUM (Shoulder) ×1 IMPLANT
IMPLANT REGENETEN MEDIUM (Shoulder) ×2 IMPLANT
IV LACTATED RINGER IRRG 3000ML (IV SOLUTION) ×2
IV LR IRRIG 3000ML ARTHROMATIC (IV SOLUTION) ×2 IMPLANT
KIT CANNULA 8X76-LX IN CANNULA (CANNULA) ×2 IMPLANT
MANIFOLD NEPTUNE II (INSTRUMENTS) ×4 IMPLANT
MASK FACE SPIDER DISP (MASK) ×2 IMPLANT
MAT ABSORB  FLUID 56X50 GRAY (MISCELLANEOUS) ×1
MAT ABSORB FLUID 56X50 GRAY (MISCELLANEOUS) ×1 IMPLANT
PACK ARTHROSCOPY SHOULDER (MISCELLANEOUS) ×2 IMPLANT
PASSER SUT FIRSTPASS SELF (INSTRUMENTS) IMPLANT
SLING ARM LRG DEEP (SOFTGOODS) IMPLANT
SLING ULTRA II LG (MISCELLANEOUS) ×2 IMPLANT
SPONGE T-LAP 18X18 ~~LOC~~+RFID (SPONGE) ×2 IMPLANT
STAPLER SKIN PROX 35W (STAPLE) ×2 IMPLANT
STRAP SAFETY 5IN WIDE (MISCELLANEOUS) ×2 IMPLANT
SUT ETHIBOND 0 MO6 C/R (SUTURE) ×2 IMPLANT
SUT ULTRABRAID 2 COBRAID 38 (SUTURE) IMPLANT
SUT VIC AB 2-0 CT1 27 (SUTURE) ×2
SUT VIC AB 2-0 CT1 TAPERPNT 27 (SUTURE) ×2 IMPLANT
TAPE MICROFOAM 4IN (TAPE) ×2 IMPLANT
TUBING INFLOW SET DBFLO PUMP (TUBING) ×2 IMPLANT
WAND WEREWOLF FLOW 90D (MISCELLANEOUS) ×2 IMPLANT
WATER STERILE IRR 500ML POUR (IV SOLUTION) ×2 IMPLANT

## 2021-05-11 NOTE — H&P (Signed)
History of Present Illness:  Janice Cook is a 75 y.o. female who presents today as a result of a call to our clinic for right shoulder pain.   The patient's symptoms began several years ago and developed without any specific cause or injury. The patient describes the symptoms as marked (major pain with significant limitations) and have the quality of being aching, constant, miserable, nagging, stabbing, tender and throbbing. The pain is localized to the lateral arm/shoulder. She also notes discomfort anteriorly and superiorly on the shoulder. These symptoms are aggravated with normal daily activities, with sleeping, carrying heavy objects, at higher levels of activity, with overhead activity, reaching behind the back and getting dressed. She has tried non-steroidal anti-inflammatories (Advil and Voltaren gel) with limited benefit. She has tried rest and heat with no significant benefit. She has not tried any steroid injections or physical therapy, but does take oral prednisone on a regular basis for her lupus. She also has diabetes and so tries to avoid too much steroid as it affects her serum glucose levels. She denies any neck pain, noted she note any numbness or paresthesias down her arm to her hand. This complaint is not work related. She is a sports non-participant.  Shoulder Surgical History:  The patient has had no shoulder surgery in the past.  PMH/PSH/Family History/Social History/Meds/Allergies:  I have reviewed past medical, surgical, social and family history, medications and allergies as documented in the EMR.  Current Outpatient Medications:  ACCU-CHEK GUIDE GLUCOSE METER Misc 1 each as directed   ACCU-CHEK GUIDE TEST STRIPS test strip 1 strip 3 (three) times daily   alendronate (FOSAMAX) 70 MG tablet Take 1 tablet (70 mg total) by mouth every 7 (seven) days Take with a full glass of water. Do not lie down for the next 30 min. 12 tablet 1   AMIOdarone (PACERONE) 200 MG tablet Take 1  tablet (200 mg total) by mouth once daily 90 tablet 4   apixaban (ELIQUIS) 5 mg tablet Take 1 tablet (5 mg total) by mouth 2 (two) times daily 60 tablet 11   diclofenac (VOLTAREN) 1 % topical gel Apply 2 g topically 3 (three) times daily 100 g 5   fenofibrate 160 MG tablet Take 160 mg by mouth once daily   fluticasone propionate (FLONASE) 50 mcg/actuation nasal spray Place 2 sprays into both nostrils once daily as needed   folic acid (FOLVITE) 1 MG tablet Take 1 tablet (1 mg total) by mouth once daily 90 tablet 3   FUROsemide (LASIX) 20 MG tablet Take 20 mg by mouth once daily   gabapentin (NEURONTIN) 300 MG capsule Take 1 capsule (300 mg total) by mouth 2 (two) times daily 60 capsule 2   insulin GLARGINE (LANTUS SOLOSTAR) pen injector (concentration 100 units/mL) Inject 8 Units subcutaneously nightly   meloxicam (MOBIC) 15 MG tablet Take 15 mg by mouth as needed   methotrexate (RHEUMATREX) 2.5 MG tablet Take 4 tablets (10 mg total) by mouth every 7 (seven) days All on the same Day 16 tablet 5   montelukast (SINGULAIR) 10 mg tablet Take 10 mg by mouth nightly   mycophenolate (CELLCEPT) 250 mg capsule Take 1 capsule (250 mg total) by mouth every 12 (twelve) hours 60 capsule 5   omeprazole (PRILOSEC) 20 MG DR capsule Take 20 mg by mouth once daily   potassium chloride (K-TAB) 20 mEq TbER ER tablet Take 20 mEq by mouth once daily   predniSONE (DELTASONE) 1 MG tablet Take 3 tablets (3 mg  total) by mouth once daily 90 tablet 5   rosuvastatin (CRESTOR) 40 MG tablet Take 40 mg by mouth once daily   TRUEPLUS PEN NEEDLE 31 gauge x 1/4" needle Inject 1 each subcutaneously as directed   valsartan (DIOVAN) 80 MG tablet Take 1 tablet (80 mg total) by mouth once daily 30 tablet 11   Allergies:   Aspirin Unknown   Iodinated Contrast Media Other (This causes her mouth to go numb. Her body gets really hot too)   Nitroglycerin Other (This made her pass out and lose consciousness)   Sulfa (Sulfonamide  Antibiotics) Unknown   Anesthesia S/I-40 (Propofol) [Propofol] Vomiting   Hydroxyzine Other (Facial swelling, rash on face, heat on face, "darts" on back)   Lactose Other (Gastric irritation)   Latex, Natural Rubber Itching and Rash   Past Medical History:   Allergic rhinitis   Diabetes mellitus without complication (CMS-HCC)   GERD (gastroesophageal reflux disease)   Heart disease - on plavix   HLD (hyperlipidemia)   Lupus (CMS-HCC)   Osteoporosis   Systemic lupus (CMS-HCC)   Past Surgical History:   CHOLECYSTECTOMY   REMOVAL OVARIAN CYST   surgery for glaucoma   Family History:   Arthritis Mother   Rashes / Skin problems Sister   Lupus Sister   Social History:   Socioeconomic History:   Marital status: Married  Occupational History   Occupation: Disability  Comment: since 1998  Tobacco Use   Smoking status: Never Smoker   Smokeless tobacco: Never Used  Building services engineer Use: Never used  Substance and Sexual Activity   Alcohol use: Never   Drug use: Never   Sexual activity: Yes  Partners: Male   Review of Systems:  A comprehensive 14 point ROS was performed, reviewed, and the pertinent orthopaedic findings are documented in the HPI.  Physical Exam:  Vitals:  04/19/21 1021  BP: 124/76  Weight: 71.7 kg (158 lb)  Height: 142.2 cm (4\' 8" )  PainSc: 10-Worst pain ever  PainLoc: Shoulder   General/Constitutional: Pleasant overweight elderly female in no acute distress. Neuro/Psych: Normal mood and affect, oriented to person, place and time. Eyes: Non-icteric. Pupils are equal, round, and reactive to light, and exhibit synchronous movement. ENT: Unremarkable. Lymphatic: No palpable adenopathy. Respiratory: Lungs clear to auscultation, Normal chest excursion, No wheezes and Non-labored breathing Cardiovascular: Irregularly irregular rate with no murmurs and No edema, swelling or tenderness, except as noted in detailed exam. Integumentary: No impressive skin  lesions present, except as noted in detailed exam. Musculoskeletal: Unremarkable, except as noted in detailed exam.  Right shoulder exam: SKIN: normal SWELLING: none WARMTH: none LYMPH NODES: no adenopathy palpable CREPITUS: none TENDERNESS: Moderate tenderness to palpation along lateral acromion, as well as over AC joint and along biceps tendon anteriorly ROM (active):  Forward flexion: 135 degrees Abduction: 125 degrees Internal rotation: Right buttock ROM (passive):  Forward flexion: 155 degrees Abduction: 145 degrees  ER/IR at 90 abd: 90 degrees / 35 degrees  She experiences moderate pain with internal rotation at 90 degrees of abduction, and mild-moderate pain with forward flexion, abduction, and internal rotation  STRENGTH: Forward flexion: 3+-4/5 Abduction: 3+-4/5 External rotation: 4/5 Internal rotation: 4/5 Pain with RC testing: Moderate pain with resisted forward flexion and abduction  STABILITY: Normal  SPECIAL TESTS: ' test: positive, moderate Speed's test: positive Capsulitis - pain w/ passive ER: no Crossed arm test: Mildly positive Crank: Not evaluated Anterior apprehension: Negative Posterior apprehension: Not evaluated  She is neurovascularly intact  to the right upper extremity.  Imaging:  Shoulder X-Ray Imaging: Recent true AP, Y-scapular, and axillary views of the right shoulder are available for review and have been reviewed by myself. These films demonstrate no evidence for fractures, lytic lesions, or significant degenerative changes. The subacromial space is mildly decreased. There is no subacromial or infra-clavicular spurring. She demonstrates a Type II acromion.  Right Shoulder Imaging, MRI: MRI Shoulder Cartilage: Partial thickness humeral head cartilage loss. MRI Shoulder Rotator Cuff: Full thickness tear of the supraspinatus. Retracted to the humeral head. MRI Shoulder Labrum / Biceps: Biceps tendinopathy. Also degenerative tearing of  the labrum. MRI Shoulder Bone: Normal bone.  Both the films report were reviewed by myself and discussed with the patient and her daughter.  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 In addition, patient educational materials were provided regarding the diagnosis and treatment options. The patient is quite frustrated by her symptoms and functional limitations, and is ready to consider more aggressive treatment options. Therefore, I have recommended a surgical procedure, specifically a right shoulder arthroscopy with debridement, decompression, rotator cuff repair, excision of distal clavicle, and biceps tenodesis. The procedure was discussed with the patient, as were the potential risks (including bleeding, infection, nerve and/or blood vessel injury, persistent or recurrent pain, failure of the repair, progression of arthritis, need for further surgery, blood clots, strokes, heart attacks and/or arhythmias, pneumonia, etc.) and benefits. The patient states her understanding and wishes to proceed. All of the patient's questions and concerns were answered. She can call any time with further concerns. She will follow up post-surgery, routine. The patient was fitted with a sling shot shoulder immobilizer for postoperative use on today's visit.   Paper H&P to be scanned into permanent record. H&P reviewed and patient re-examined. No changes.

## 2021-05-11 NOTE — Anesthesia Preprocedure Evaluation (Signed)
Anesthesia Evaluation  Patient identified by MRN, date of birth, ID band Patient awake    Reviewed: Allergy & Precautions, NPO status , Patient's Chart, lab work & pertinent test results  History of Anesthesia Complications (+) PONV and history of anesthetic complications  Airway Mallampati: II  TM Distance: >3 FB Neck ROM: Full    Dental  (+) Lower Dentures   Pulmonary asthma , neg sleep apnea,    breath sounds clear to auscultation- rhonchi (-) wheezing      Cardiovascular hypertension, Pt. on medications (-) angina(-) CAD, (-) Past MI, (-) Cardiac Stents and (-) CABG + dysrhythmias Atrial Fibrillation  Rhythm:Regular Rate:Normal - Systolic murmurs and - Diastolic murmurs    Neuro/Psych neg Seizures negative neurological ROS  negative psych ROS   GI/Hepatic Neg liver ROS, GERD  ,  Endo/Other  diabetes, Insulin Dependent  Renal/GU negative Renal ROS     Musculoskeletal negative musculoskeletal ROS (+)   Abdominal (+) + obese,   Peds  Hematology negative hematology ROS (+)   Anesthesia Other Findings Past Medical History: No date: Anginal pain (HCC) No date: Asthma No date: Chronic anticoagulation     Comment:  Apixaban No date: Coronary artery disease No date: GERD (gastroesophageal reflux disease) No date: Grade I diastolic dysfunction 04/20/2021: History of 2019 novel coronavirus disease (COVID-19)     Comment:  Tested at [facility]; retested as (+) on 04/30/2021 No date: HLD (hyperlipidemia) No date: Hypertension No date: Long term current use of immunosuppressive drug     Comment:  MTX and mycophenolate; managed by rheumatology for Dx of              SLE No date: Lupus (HCC) No date: Osteoporosis No date: PAF (paroxysmal atrial fibrillation) (HCC) No date: PONV (postoperative nausea and vomiting) No date: T2DM (type 2 diabetes mellitus) (HCC)   Reproductive/Obstetrics                              Anesthesia Physical Anesthesia Plan  ASA: 3  Anesthesia Plan: General   Post-op Pain Management:  Regional for Post-op pain   Induction: Intravenous  PONV Risk Score and Plan: 3 and Ondansetron, Aprepitant, Dexamethasone and Midazolam  Airway Management Planned: Oral ETT  Additional Equipment:   Intra-op Plan:   Post-operative Plan: Extubation in OR  Informed Consent: I have reviewed the patients History and Physical, chart, labs and discussed the procedure including the risks, benefits and alternatives for the proposed anesthesia with the patient or authorized representative who has indicated his/her understanding and acceptance.     Dental advisory given  Plan Discussed with: CRNA and Anesthesiologist  Anesthesia Plan Comments:         Anesthesia Quick Evaluation

## 2021-05-11 NOTE — Transfer of Care (Addendum)
Immediate Anesthesia Transfer of Care Note  Patient: Janice Cook  Procedure(s) Performed: SHOULDER ARTHROSCOPY WITH DEBRIDEMENT, DECOMPRESSION, ROTATOR CUFF REPAIR, BICEPS TENODESIS AND DISTAL CLAVICLE EXCISION. (Right: Shoulder)  Patient Location: PACU  Anesthesia Type:General  Level of Consciousness: awake, alert , oriented and patient cooperative  Airway & Oxygen Therapy: Patient Spontanous Breathing and Patient connected to face mask oxygen  Post-op Assessment: Report given to RN and Post -op Vital signs reviewed and stable  Post vital signs: Reviewed and stable  Last Vitals:  Vitals Value Taken Time  BP 142/60 05/11/21 1506  Temp    Pulse 88 05/11/21 1510  Resp 22 05/11/21 1510  SpO2 94 % 05/11/21 1510  Vitals shown include unvalidated device data.  Last Pain:  Vitals:   05/11/21 1101  TempSrc: Tympanic  PainSc: 0-No pain         Complications: No notable events documented.

## 2021-05-11 NOTE — Op Note (Signed)
6008/30/2022  3:02 PM  Patient:   Janice Cook  Pre-Op Diagnosis:   Impingement/tendinopathy with full-thickness rotator cuff tear, degenerative labral fraying, and biceps tendinopathy, right shoulder.  Post-Op Diagnosis:   Impingement/tendinopathy with full-thickness rotator cuff tear, degenerative labral fraying, and biceps tendinopathy, right shoulder.  Procedure:   Extensive arthroscopic debridement, arthroscopic subscapularis tendon repair, arthroscopic subacromial decompression, and mini-open repair of supraspinatus tendon tear, right shoulder.  Anesthesia:   General endotracheal with interscalene block using Exparel placed preoperatively by the anesthesiologist.  Surgeon:   Maryagnes Amos, MD  Assistant:   Horris Latino, PA-C   Findings:   As above.  There was an extensive articular sided tear involving the superior insertional fibers of the subscapularis tendon, and a full-thickness tear involving the anterior and mid insertional fibers of the supraspinatus tendon.  The remainder of the rotator cuff was in satisfactory condition.  There was extensive partial-thickness degenerative tearing of the biceps tendon.  There was moderate labral fraying involving the anterior and superior portions of the labrum without frank detachment from the glenoid rim.  The articular surfaces of the glenoid and humerus both were in satisfactory condition.  Complications:   None  Fluids:   600 cc  Estimated blood loss:   10 cc  Tourniquet time:   None  Drains:   None  Closure:   Staples      Brief clinical note:   The patient is a 75 year old female with a history of progressively worsening right shoulder pain. The patient's symptoms have progressed despite medications, activity modification, etc. The patient's history and examination are consistent with impingement/tendinopathy with a rotator cuff tear. These findings were confirmed by MRI scan. The patient presents at this time for  definitive management of these shoulder symptoms.  Procedure:   The patient underwent placement of an interscalene block using Exparel by the anesthesiologist in the preoperative holding area before being brought into the operating room and lain in the supine position. The patient then underwent general endotracheal intubation and anesthesia before being repositioned in the beach chair position using the beach chair positioner. The right shoulder and upper extremity were prepped with ChloraPrep solution before being draped sterilely. Preoperative antibiotics were administered. A timeout was performed to confirm the proper surgical site before the expected portal sites and incision site were injected with 0.5% Sensorcaine with epinephrine.   A posterior portal was created and the glenohumeral joint thoroughly inspected with the findings as described above. An anterior portal was created using an outside-in technique. The labrum and rotator cuff were further probed, again confirming the above-noted findings. The areas of labral fraying were debrided back to stable margins using the full-radius resector, as were the frayed portions of the biceps tendon and rotator cuff. Finally, areas of synovitis were debrided back to stable margins using the full-radius resector. The ArthroCare wand was inserted and used to release the biceps tendon from its labral anchor. Given the patient's age and lack of activity, it was felt best to perform a tenolysis rather than a tenodesis. The ArthroCare wand also was used to obtain hemostasis as well as to "anneal" the labrum superiorly and anteriorly.   A separate superolateral portal was created to serve as a working portal before the full-radius resector was introduced through this portal and used to abrade the exposed portion of the lesser tuberosity.  The superior portion of the subscapularis tendon was repaired using a single Mitek BioKnotless anchor placed through the anterior  portal. The  adequacy of repair was verified both by probing and with passive external rotation of the arm.  A single the instruments were removed from the joint after suctioning the excess fluid.  The camera was repositioned through the posterior portal into the subacromial space. A separate lateral portal was created using an outside-in technique. The 3.5 mm full-radius resector was introduced and used to perform a subtotal bursectomy. The ArthroCare wand was then inserted and used to remove the periosteal tissue off the undersurface of the anterior third of the acromion as well as to recess the coracoacromial ligament from its attachment along the anterior and lateral margins of the acromion. The 4.0 mm acromionizing bur was introduced and used to complete the decompression by removing the undersurface of the anterior third of the acromion. The full radius resector was reintroduced to remove any residual bony debris before the ArthroCare wand was reintroduced to obtain hemostasis. The instruments were then removed from the subacromial space after suctioning the excess fluid.  An approximately 4-5 cm incision was made over the anterolateral aspect of the shoulder beginning at the anterolateral corner of the acromion and extending distally in line with the bicipital groove. This incision was carried down through the subcutaneous tissues to expose the deltoid fascia. The raphae between the anterior and middle thirds was identified and this plane developed to provide access into the subacromial space. Additional bursal tissues were debrided sharply using Metzenbaum scissors. The rotator cuff tear was readily identified. The margins were debrided sharply with a #15 blade and the exposed greater tuberosity roughened with a rongeur. The tear was repaired using a single Biomet 2.9 mm JuggerKnot anchor. These sutures were then brought back laterally and secured using one [employer] Healicoil knotless RegeneSorb  anchor to create a two-layer closure. Because of the quality of tissue and the patient's age, is felt best to proceed with reinforcing this repair using a Yahoo regenerative and patch. Therefore, a medium patch was selected and applied over the rotator cuff repair, securing the patch with the appropriate bone and soft tissue staples. An apparent watertight closure was obtained.  The bicipital groove was identified by palpation and opened for 1-1.5 cm. The biceps tendon stump was retrieved through this defect. The floor of the bicipital groove was roughened with a curet before another Biomet 2.9 mm JuggerKnot anchor was inserted. Both sets of sutures were passed through the biceps tendon and tied securely to effect the tenodesis. The bicipital sheath was reapproximated using two #0 Ethibond interrupted sutures, incorporating the biceps tendon to further reinforce the tenodesis.  The wound was copiously irrigated with sterile saline solution before the deltoid raphae was reapproximated using 2-0 Vicryl interrupted sutures. The subcutaneous tissues were closed in two layers using 2-0 Vicryl interrupted sutures before the skin was closed using staples. The portal sites also were closed using staples. A sterile bulky dressing was applied to the shoulder before the arm was placed into a shoulder immobilizer. The patient was then awakened, extubated, and returned to the recovery room in satisfactory condition after tolerating the procedure well.

## 2021-05-11 NOTE — Anesthesia Postprocedure Evaluation (Signed)
Anesthesia Post Note  Patient: Janice Cook  Procedure(s) Performed: SHOULDER ARTHROSCOPY WITH DEBRIDEMENT, DECOMPRESSION, ROTATOR CUFF REPAIR, BICEPS TENODESIS AND DISTAL CLAVICLE EXCISION. (Right: Shoulder)  Patient location during evaluation: PACU Anesthesia Type: General Level of consciousness: awake and alert and oriented Pain management: pain level controlled Vital Signs Assessment: post-procedure vital signs reviewed and stable Respiratory status: spontaneous breathing, nonlabored ventilation and respiratory function stable Cardiovascular status: blood pressure returned to baseline and stable Postop Assessment: no signs of nausea or vomiting Anesthetic complications: no   No notable events documented.   Last Vitals:  Vitals:   05/11/21 1530 05/11/21 1547  BP: (!) 113/42 (!) 117/53  Pulse: 85 86  Resp: 17 14  Temp: (!) 36.2 C (!) 36.4 C  SpO2: 94% 96%    Last Pain:  Vitals:   05/11/21 1547  TempSrc: Temporal  PainSc: 0-No pain                 Nikeria Kalman

## 2021-05-11 NOTE — Discharge Instructions (Addendum)
Orthopedic discharge instructions: Keep dressing dry and intact.  May shower after dressing changed on post-op day #4 (Saturday).  Cover staples with Band-Aids after drying off. Apply ice frequently to shoulder. Take Mobic 15 mg daily with food for 7-10 days, then as necessary. Take oxycodone as prescribed when needed.  May supplement with ES Tylenol if necessary. Keep shoulder immobilizer on at all times except may remove for bathing purposes. Follow-up in 10-14 days or as scheduled. Restart Eliquis tomorrow morning (05/12/21).AMBULATORY SURGERY  DISCHARGE INSTRUCTIONS   The drugs that you were given will stay in your system until tomorrow so for the next 24 hours you should not:  Drive an automobile Make any legal decisions Drink any alcoholic beverage   You may resume regular meals tomorrow.  Today it is better to start with liquids and gradually work up to solid foods.  You may eat anything you prefer, but it is better to start with liquids, then soup and crackers, and gradually work up to solid foods.   Please notify your doctor immediately if you have any unusual bleeding, trouble breathing, redness and pain at the surgery site, drainage, fever, or pain not relieved by medication.     Your post-operative visit with Dr.                                       is: Date:                        Time:    Please call to schedule your post-operative visit.  Additional Instructions:

## 2021-05-11 NOTE — Anesthesia Procedure Notes (Addendum)
Procedure Name: Intubation Date/Time: 05/11/2021 1:22 PM Performed by: Theresa Mulligan, RN Pre-anesthesia Checklist: Patient identified, Emergency Drugs available, Suction available and Patient being monitored Patient Re-evaluated:Patient Re-evaluated prior to induction Oxygen Delivery Method: Circle system utilized Preoxygenation: Pre-oxygenation with 100% oxygen Induction Type: IV induction Ventilation: Mask ventilation without difficulty Laryngoscope Size: Mac, McGraph and 3 Grade View: Grade I Tube type: Oral Tube size: 6.5 mm Number of attempts: 1 Airway Equipment and Method: Stylet, Oral airway and Video-laryngoscopy Placement Confirmation: ETT inserted through vocal cords under direct vision, positive ETCO2 and breath sounds checked- equal and bilateral Secured at: 20 cm Tube secured with: Tape Dental Injury: Teeth and Oropharynx as per pre-operative assessment

## 2021-05-12 ENCOUNTER — Encounter: Payer: Self-pay | Admitting: Surgery

## 2021-07-16 ENCOUNTER — Other Ambulatory Visit: Payer: Self-pay | Admitting: Family Medicine

## 2021-07-16 DIAGNOSIS — Z1231 Encounter for screening mammogram for malignant neoplasm of breast: Secondary | ICD-10-CM

## 2021-09-14 DIAGNOSIS — M25511 Pain in right shoulder: Secondary | ICD-10-CM | POA: Diagnosis not present

## 2021-09-14 DIAGNOSIS — G8929 Other chronic pain: Secondary | ICD-10-CM | POA: Diagnosis not present

## 2021-09-16 DIAGNOSIS — M25511 Pain in right shoulder: Secondary | ICD-10-CM | POA: Diagnosis not present

## 2021-09-16 DIAGNOSIS — G8929 Other chronic pain: Secondary | ICD-10-CM | POA: Diagnosis not present

## 2021-09-23 DIAGNOSIS — G8929 Other chronic pain: Secondary | ICD-10-CM | POA: Diagnosis not present

## 2021-09-23 DIAGNOSIS — M25511 Pain in right shoulder: Secondary | ICD-10-CM | POA: Diagnosis not present

## 2021-09-28 DIAGNOSIS — M25511 Pain in right shoulder: Secondary | ICD-10-CM | POA: Diagnosis not present

## 2021-09-28 DIAGNOSIS — G8929 Other chronic pain: Secondary | ICD-10-CM | POA: Diagnosis not present

## 2021-09-29 DIAGNOSIS — H524 Presbyopia: Secondary | ICD-10-CM | POA: Diagnosis not present

## 2021-09-30 DIAGNOSIS — M25511 Pain in right shoulder: Secondary | ICD-10-CM | POA: Diagnosis not present

## 2021-09-30 DIAGNOSIS — G8929 Other chronic pain: Secondary | ICD-10-CM | POA: Diagnosis not present

## 2021-10-04 ENCOUNTER — Ambulatory Visit
Admission: RE | Admit: 2021-10-04 | Discharge: 2021-10-04 | Disposition: A | Discharge status: Home / Self Care | Payer: No Typology Code available for payment source | Source: Ambulatory Visit | Attending: Family Medicine | Admitting: Family Medicine

## 2021-10-04 DIAGNOSIS — Z1231 Encounter for screening mammogram for malignant neoplasm of breast: Secondary | ICD-10-CM | POA: Insufficient documentation

## 2021-10-06 DIAGNOSIS — R69 Illness, unspecified: Secondary | ICD-10-CM | POA: Diagnosis not present

## 2021-10-07 DIAGNOSIS — I6523 Occlusion and stenosis of bilateral carotid arteries: Secondary | ICD-10-CM | POA: Diagnosis not present

## 2021-10-07 DIAGNOSIS — R42 Dizziness and giddiness: Secondary | ICD-10-CM | POA: Diagnosis not present

## 2021-10-07 DIAGNOSIS — I480 Paroxysmal atrial fibrillation: Secondary | ICD-10-CM | POA: Diagnosis not present

## 2021-10-07 DIAGNOSIS — I10 Essential (primary) hypertension: Secondary | ICD-10-CM | POA: Diagnosis not present

## 2021-10-07 DIAGNOSIS — I2510 Atherosclerotic heart disease of native coronary artery without angina pectoris: Secondary | ICD-10-CM | POA: Diagnosis not present

## 2021-10-07 DIAGNOSIS — E782 Mixed hyperlipidemia: Secondary | ICD-10-CM | POA: Diagnosis not present

## 2021-10-08 DIAGNOSIS — M19011 Primary osteoarthritis, right shoulder: Secondary | ICD-10-CM | POA: Diagnosis not present

## 2021-10-08 DIAGNOSIS — M24111 Other articular cartilage disorders, right shoulder: Secondary | ICD-10-CM | POA: Diagnosis not present

## 2021-10-08 DIAGNOSIS — E1142 Type 2 diabetes mellitus with diabetic polyneuropathy: Secondary | ICD-10-CM | POA: Diagnosis not present

## 2021-10-08 DIAGNOSIS — M7521 Bicipital tendinitis, right shoulder: Secondary | ICD-10-CM | POA: Diagnosis not present

## 2021-10-08 DIAGNOSIS — M7581 Other shoulder lesions, right shoulder: Secondary | ICD-10-CM | POA: Diagnosis not present

## 2021-10-08 DIAGNOSIS — M75121 Complete rotator cuff tear or rupture of right shoulder, not specified as traumatic: Secondary | ICD-10-CM | POA: Diagnosis not present

## 2021-10-13 DIAGNOSIS — M25511 Pain in right shoulder: Secondary | ICD-10-CM | POA: Diagnosis not present

## 2021-10-13 DIAGNOSIS — G8929 Other chronic pain: Secondary | ICD-10-CM | POA: Diagnosis not present

## 2021-10-20 DIAGNOSIS — G8929 Other chronic pain: Secondary | ICD-10-CM | POA: Diagnosis not present

## 2021-10-20 DIAGNOSIS — M25511 Pain in right shoulder: Secondary | ICD-10-CM | POA: Diagnosis not present

## 2021-10-29 DIAGNOSIS — G8929 Other chronic pain: Secondary | ICD-10-CM | POA: Diagnosis not present

## 2021-10-29 DIAGNOSIS — M25511 Pain in right shoulder: Secondary | ICD-10-CM | POA: Diagnosis not present

## 2021-11-11 DIAGNOSIS — E785 Hyperlipidemia, unspecified: Secondary | ICD-10-CM | POA: Diagnosis not present

## 2021-11-11 DIAGNOSIS — I6523 Occlusion and stenosis of bilateral carotid arteries: Secondary | ICD-10-CM | POA: Diagnosis not present

## 2021-11-11 DIAGNOSIS — E119 Type 2 diabetes mellitus without complications: Secondary | ICD-10-CM | POA: Diagnosis not present

## 2021-11-11 DIAGNOSIS — R42 Dizziness and giddiness: Secondary | ICD-10-CM | POA: Diagnosis not present

## 2021-11-11 DIAGNOSIS — I10 Essential (primary) hypertension: Secondary | ICD-10-CM | POA: Diagnosis not present

## 2021-11-12 DIAGNOSIS — H34831 Tributary (branch) retinal vein occlusion, right eye, with macular edema: Secondary | ICD-10-CM | POA: Diagnosis not present

## 2021-11-15 DIAGNOSIS — Z794 Long term (current) use of insulin: Secondary | ICD-10-CM | POA: Diagnosis not present

## 2021-11-15 DIAGNOSIS — E782 Mixed hyperlipidemia: Secondary | ICD-10-CM | POA: Diagnosis not present

## 2021-11-15 DIAGNOSIS — I2510 Atherosclerotic heart disease of native coronary artery without angina pectoris: Secondary | ICD-10-CM | POA: Diagnosis not present

## 2021-11-15 DIAGNOSIS — I10 Essential (primary) hypertension: Secondary | ICD-10-CM | POA: Diagnosis not present

## 2021-11-15 DIAGNOSIS — E119 Type 2 diabetes mellitus without complications: Secondary | ICD-10-CM | POA: Diagnosis not present

## 2021-11-15 DIAGNOSIS — I480 Paroxysmal atrial fibrillation: Secondary | ICD-10-CM | POA: Diagnosis not present

## 2021-11-18 DIAGNOSIS — M159 Polyosteoarthritis, unspecified: Secondary | ICD-10-CM | POA: Diagnosis not present

## 2021-11-18 DIAGNOSIS — M47816 Spondylosis without myelopathy or radiculopathy, lumbar region: Secondary | ICD-10-CM | POA: Diagnosis not present

## 2021-11-18 DIAGNOSIS — Z79899 Other long term (current) drug therapy: Secondary | ICD-10-CM | POA: Diagnosis not present

## 2021-11-18 DIAGNOSIS — Z1321 Encounter for screening for nutritional disorder: Secondary | ICD-10-CM | POA: Diagnosis not present

## 2021-11-18 DIAGNOSIS — M329 Systemic lupus erythematosus, unspecified: Secondary | ICD-10-CM | POA: Diagnosis not present

## 2021-11-18 DIAGNOSIS — M810 Age-related osteoporosis without current pathological fracture: Secondary | ICD-10-CM | POA: Diagnosis not present

## 2021-11-26 DIAGNOSIS — B351 Tinea unguium: Secondary | ICD-10-CM | POA: Diagnosis not present

## 2021-11-26 DIAGNOSIS — E1142 Type 2 diabetes mellitus with diabetic polyneuropathy: Secondary | ICD-10-CM | POA: Diagnosis not present

## 2022-01-20 DIAGNOSIS — E1100 Type 2 diabetes mellitus with hyperosmolarity without nonketotic hyperglycemic-hyperosmolar coma (NKHHC): Secondary | ICD-10-CM | POA: Diagnosis not present

## 2022-01-20 DIAGNOSIS — Z1159 Encounter for screening for other viral diseases: Secondary | ICD-10-CM | POA: Diagnosis not present

## 2022-01-20 DIAGNOSIS — Z794 Long term (current) use of insulin: Secondary | ICD-10-CM | POA: Diagnosis not present

## 2022-01-20 DIAGNOSIS — Z780 Asymptomatic menopausal state: Secondary | ICD-10-CM | POA: Diagnosis not present

## 2022-01-20 DIAGNOSIS — E782 Mixed hyperlipidemia: Secondary | ICD-10-CM | POA: Diagnosis not present

## 2022-01-20 DIAGNOSIS — M329 Systemic lupus erythematosus, unspecified: Secondary | ICD-10-CM | POA: Diagnosis not present

## 2022-01-20 DIAGNOSIS — M24111 Other articular cartilage disorders, right shoulder: Secondary | ICD-10-CM | POA: Diagnosis not present

## 2022-01-20 DIAGNOSIS — I480 Paroxysmal atrial fibrillation: Secondary | ICD-10-CM | POA: Diagnosis not present

## 2022-01-20 DIAGNOSIS — Z0000 Encounter for general adult medical examination without abnormal findings: Secondary | ICD-10-CM | POA: Diagnosis not present

## 2022-01-20 DIAGNOSIS — I10 Essential (primary) hypertension: Secondary | ICD-10-CM | POA: Diagnosis not present

## 2022-01-20 DIAGNOSIS — R7989 Other specified abnormal findings of blood chemistry: Secondary | ICD-10-CM | POA: Diagnosis not present

## 2022-01-20 DIAGNOSIS — R946 Abnormal results of thyroid function studies: Secondary | ICD-10-CM | POA: Diagnosis not present

## 2022-01-20 DIAGNOSIS — E11319 Type 2 diabetes mellitus with unspecified diabetic retinopathy without macular edema: Secondary | ICD-10-CM | POA: Diagnosis not present

## 2022-01-20 DIAGNOSIS — I2510 Atherosclerotic heart disease of native coronary artery without angina pectoris: Secondary | ICD-10-CM | POA: Diagnosis not present

## 2022-01-31 DIAGNOSIS — M810 Age-related osteoporosis without current pathological fracture: Secondary | ICD-10-CM | POA: Diagnosis not present

## 2022-02-28 DIAGNOSIS — L851 Acquired keratosis [keratoderma] palmaris et plantaris: Secondary | ICD-10-CM | POA: Diagnosis not present

## 2022-02-28 DIAGNOSIS — E1142 Type 2 diabetes mellitus with diabetic polyneuropathy: Secondary | ICD-10-CM | POA: Diagnosis not present

## 2022-02-28 DIAGNOSIS — B351 Tinea unguium: Secondary | ICD-10-CM | POA: Diagnosis not present

## 2022-03-23 DIAGNOSIS — Z79899 Other long term (current) drug therapy: Secondary | ICD-10-CM | POA: Diagnosis not present

## 2022-03-23 DIAGNOSIS — M810 Age-related osteoporosis without current pathological fracture: Secondary | ICD-10-CM | POA: Diagnosis not present

## 2022-03-23 DIAGNOSIS — M1812 Unilateral primary osteoarthritis of first carpometacarpal joint, left hand: Secondary | ICD-10-CM | POA: Diagnosis not present

## 2022-03-23 DIAGNOSIS — G5602 Carpal tunnel syndrome, left upper limb: Secondary | ICD-10-CM | POA: Diagnosis not present

## 2022-03-23 DIAGNOSIS — M159 Polyosteoarthritis, unspecified: Secondary | ICD-10-CM | POA: Diagnosis not present

## 2022-03-23 DIAGNOSIS — M329 Systemic lupus erythematosus, unspecified: Secondary | ICD-10-CM | POA: Diagnosis not present

## 2022-05-04 DIAGNOSIS — E7800 Pure hypercholesterolemia, unspecified: Secondary | ICD-10-CM | POA: Diagnosis not present

## 2022-05-04 DIAGNOSIS — E1165 Type 2 diabetes mellitus with hyperglycemia: Secondary | ICD-10-CM | POA: Diagnosis not present

## 2022-05-04 DIAGNOSIS — E039 Hypothyroidism, unspecified: Secondary | ICD-10-CM | POA: Diagnosis not present

## 2022-05-04 DIAGNOSIS — M810 Age-related osteoporosis without current pathological fracture: Secondary | ICD-10-CM | POA: Diagnosis not present

## 2022-05-17 DIAGNOSIS — I10 Essential (primary) hypertension: Secondary | ICD-10-CM | POA: Diagnosis not present

## 2022-05-17 DIAGNOSIS — I2510 Atherosclerotic heart disease of native coronary artery without angina pectoris: Secondary | ICD-10-CM | POA: Diagnosis not present

## 2022-05-17 DIAGNOSIS — I480 Paroxysmal atrial fibrillation: Secondary | ICD-10-CM | POA: Diagnosis not present

## 2022-05-17 DIAGNOSIS — E782 Mixed hyperlipidemia: Secondary | ICD-10-CM | POA: Diagnosis not present

## 2022-05-25 DIAGNOSIS — E039 Hypothyroidism, unspecified: Secondary | ICD-10-CM | POA: Diagnosis not present

## 2022-05-25 DIAGNOSIS — R202 Paresthesia of skin: Secondary | ICD-10-CM | POA: Diagnosis not present

## 2022-05-25 DIAGNOSIS — R200 Anesthesia of skin: Secondary | ICD-10-CM | POA: Diagnosis not present

## 2022-05-25 DIAGNOSIS — M329 Systemic lupus erythematosus, unspecified: Secondary | ICD-10-CM | POA: Diagnosis not present

## 2022-05-25 DIAGNOSIS — E1100 Type 2 diabetes mellitus with hyperosmolarity without nonketotic hyperglycemic-hyperosmolar coma (NKHHC): Secondary | ICD-10-CM | POA: Diagnosis not present

## 2022-05-25 DIAGNOSIS — Z794 Long term (current) use of insulin: Secondary | ICD-10-CM | POA: Diagnosis not present

## 2022-05-25 DIAGNOSIS — I10 Essential (primary) hypertension: Secondary | ICD-10-CM | POA: Diagnosis not present

## 2022-05-25 DIAGNOSIS — I480 Paroxysmal atrial fibrillation: Secondary | ICD-10-CM | POA: Diagnosis not present

## 2022-05-31 DIAGNOSIS — E063 Autoimmune thyroiditis: Secondary | ICD-10-CM | POA: Diagnosis not present

## 2022-06-03 DIAGNOSIS — B351 Tinea unguium: Secondary | ICD-10-CM | POA: Diagnosis not present

## 2022-06-03 DIAGNOSIS — E1142 Type 2 diabetes mellitus with diabetic polyneuropathy: Secondary | ICD-10-CM | POA: Diagnosis not present

## 2022-06-03 DIAGNOSIS — L851 Acquired keratosis [keratoderma] palmaris et plantaris: Secondary | ICD-10-CM | POA: Diagnosis not present

## 2022-06-22 DIAGNOSIS — M1812 Unilateral primary osteoarthritis of first carpometacarpal joint, left hand: Secondary | ICD-10-CM | POA: Diagnosis not present

## 2022-06-22 DIAGNOSIS — G5602 Carpal tunnel syndrome, left upper limb: Secondary | ICD-10-CM | POA: Diagnosis not present

## 2022-06-22 DIAGNOSIS — E1142 Type 2 diabetes mellitus with diabetic polyneuropathy: Secondary | ICD-10-CM | POA: Diagnosis not present

## 2022-06-23 DIAGNOSIS — M5136 Other intervertebral disc degeneration, lumbar region: Secondary | ICD-10-CM | POA: Diagnosis not present

## 2022-06-23 DIAGNOSIS — M810 Age-related osteoporosis without current pathological fracture: Secondary | ICD-10-CM | POA: Diagnosis not present

## 2022-06-23 DIAGNOSIS — M329 Systemic lupus erythematosus, unspecified: Secondary | ICD-10-CM | POA: Diagnosis not present

## 2022-06-23 DIAGNOSIS — M159 Polyosteoarthritis, unspecified: Secondary | ICD-10-CM | POA: Diagnosis not present

## 2022-06-23 DIAGNOSIS — Z79899 Other long term (current) drug therapy: Secondary | ICD-10-CM | POA: Diagnosis not present

## 2022-06-28 DIAGNOSIS — E063 Autoimmune thyroiditis: Secondary | ICD-10-CM | POA: Diagnosis not present

## 2022-07-04 DIAGNOSIS — Z6832 Body mass index (BMI) 32.0-32.9, adult: Secondary | ICD-10-CM | POA: Diagnosis not present

## 2022-07-04 DIAGNOSIS — I4891 Unspecified atrial fibrillation: Secondary | ICD-10-CM | POA: Diagnosis not present

## 2022-07-04 DIAGNOSIS — D84821 Immunodeficiency due to drugs: Secondary | ICD-10-CM | POA: Diagnosis not present

## 2022-07-04 DIAGNOSIS — M810 Age-related osteoporosis without current pathological fracture: Secondary | ICD-10-CM | POA: Diagnosis not present

## 2022-07-04 DIAGNOSIS — I700 Atherosclerosis of aorta: Secondary | ICD-10-CM | POA: Diagnosis not present

## 2022-07-04 DIAGNOSIS — E1169 Type 2 diabetes mellitus with other specified complication: Secondary | ICD-10-CM | POA: Diagnosis not present

## 2022-07-04 DIAGNOSIS — E039 Hypothyroidism, unspecified: Secondary | ICD-10-CM | POA: Diagnosis not present

## 2022-07-04 DIAGNOSIS — M329 Systemic lupus erythematosus, unspecified: Secondary | ICD-10-CM | POA: Diagnosis not present

## 2022-07-04 DIAGNOSIS — R2681 Unsteadiness on feet: Secondary | ICD-10-CM | POA: Diagnosis not present

## 2022-07-04 DIAGNOSIS — Z794 Long term (current) use of insulin: Secondary | ICD-10-CM | POA: Diagnosis not present

## 2022-07-04 DIAGNOSIS — E785 Hyperlipidemia, unspecified: Secondary | ICD-10-CM | POA: Diagnosis not present

## 2022-07-04 DIAGNOSIS — I10 Essential (primary) hypertension: Secondary | ICD-10-CM | POA: Diagnosis not present

## 2022-07-04 DIAGNOSIS — Z7901 Long term (current) use of anticoagulants: Secondary | ICD-10-CM | POA: Diagnosis not present

## 2022-07-04 DIAGNOSIS — E669 Obesity, unspecified: Secondary | ICD-10-CM | POA: Diagnosis not present

## 2022-07-04 DIAGNOSIS — D6869 Other thrombophilia: Secondary | ICD-10-CM | POA: Diagnosis not present

## 2022-07-04 DIAGNOSIS — E1142 Type 2 diabetes mellitus with diabetic polyneuropathy: Secondary | ICD-10-CM | POA: Diagnosis not present

## 2022-07-04 DIAGNOSIS — Z86010 Personal history of colonic polyps: Secondary | ICD-10-CM | POA: Diagnosis not present

## 2022-07-04 DIAGNOSIS — K219 Gastro-esophageal reflux disease without esophagitis: Secondary | ICD-10-CM | POA: Diagnosis not present

## 2022-07-04 DIAGNOSIS — J45909 Unspecified asthma, uncomplicated: Secondary | ICD-10-CM | POA: Diagnosis not present

## 2022-07-04 DIAGNOSIS — Z008 Encounter for other general examination: Secondary | ICD-10-CM | POA: Diagnosis not present

## 2022-08-01 DIAGNOSIS — M545 Low back pain, unspecified: Secondary | ICD-10-CM | POA: Diagnosis not present

## 2022-08-01 DIAGNOSIS — M47816 Spondylosis without myelopathy or radiculopathy, lumbar region: Secondary | ICD-10-CM | POA: Diagnosis not present

## 2022-08-01 DIAGNOSIS — M160 Bilateral primary osteoarthritis of hip: Secondary | ICD-10-CM | POA: Diagnosis not present

## 2022-08-01 DIAGNOSIS — M5416 Radiculopathy, lumbar region: Secondary | ICD-10-CM | POA: Diagnosis not present

## 2022-08-01 DIAGNOSIS — M5136 Other intervertebral disc degeneration, lumbar region: Secondary | ICD-10-CM | POA: Diagnosis not present

## 2022-08-03 DIAGNOSIS — G5602 Carpal tunnel syndrome, left upper limb: Secondary | ICD-10-CM | POA: Diagnosis not present

## 2022-08-26 DIAGNOSIS — G5602 Carpal tunnel syndrome, left upper limb: Secondary | ICD-10-CM | POA: Diagnosis not present

## 2022-08-29 DIAGNOSIS — M5136 Other intervertebral disc degeneration, lumbar region: Secondary | ICD-10-CM | POA: Diagnosis not present

## 2022-08-31 DIAGNOSIS — M216X1 Other acquired deformities of right foot: Secondary | ICD-10-CM | POA: Diagnosis not present

## 2022-08-31 DIAGNOSIS — M7742 Metatarsalgia, left foot: Secondary | ICD-10-CM | POA: Diagnosis not present

## 2022-08-31 DIAGNOSIS — M79675 Pain in left toe(s): Secondary | ICD-10-CM | POA: Diagnosis not present

## 2022-08-31 DIAGNOSIS — L851 Acquired keratosis [keratoderma] palmaris et plantaris: Secondary | ICD-10-CM | POA: Diagnosis not present

## 2022-08-31 DIAGNOSIS — B353 Tinea pedis: Secondary | ICD-10-CM | POA: Diagnosis not present

## 2022-08-31 DIAGNOSIS — M2041 Other hammer toe(s) (acquired), right foot: Secondary | ICD-10-CM | POA: Diagnosis not present

## 2022-08-31 DIAGNOSIS — E1142 Type 2 diabetes mellitus with diabetic polyneuropathy: Secondary | ICD-10-CM | POA: Diagnosis not present

## 2022-08-31 DIAGNOSIS — M216X2 Other acquired deformities of left foot: Secondary | ICD-10-CM | POA: Diagnosis not present

## 2022-08-31 DIAGNOSIS — B351 Tinea unguium: Secondary | ICD-10-CM | POA: Diagnosis not present

## 2022-08-31 DIAGNOSIS — M14672 Charcot's joint, left ankle and foot: Secondary | ICD-10-CM | POA: Diagnosis not present

## 2022-08-31 DIAGNOSIS — M7741 Metatarsalgia, right foot: Secondary | ICD-10-CM | POA: Diagnosis not present

## 2022-08-31 DIAGNOSIS — M2042 Other hammer toe(s) (acquired), left foot: Secondary | ICD-10-CM | POA: Diagnosis not present

## 2022-08-31 DIAGNOSIS — M792 Neuralgia and neuritis, unspecified: Secondary | ICD-10-CM | POA: Diagnosis not present

## 2022-08-31 DIAGNOSIS — M79674 Pain in right toe(s): Secondary | ICD-10-CM | POA: Diagnosis not present

## 2022-09-16 DIAGNOSIS — H524 Presbyopia: Secondary | ICD-10-CM | POA: Diagnosis not present

## 2022-09-26 DIAGNOSIS — E1100 Type 2 diabetes mellitus with hyperosmolarity without nonketotic hyperglycemic-hyperosmolar coma (NKHHC): Secondary | ICD-10-CM | POA: Diagnosis not present

## 2022-09-26 DIAGNOSIS — I480 Paroxysmal atrial fibrillation: Secondary | ICD-10-CM | POA: Diagnosis not present

## 2022-09-26 DIAGNOSIS — E039 Hypothyroidism, unspecified: Secondary | ICD-10-CM | POA: Diagnosis not present

## 2022-09-26 DIAGNOSIS — G8929 Other chronic pain: Secondary | ICD-10-CM | POA: Diagnosis not present

## 2022-09-26 DIAGNOSIS — M329 Systemic lupus erythematosus, unspecified: Secondary | ICD-10-CM | POA: Diagnosis not present

## 2022-09-26 DIAGNOSIS — M5416 Radiculopathy, lumbar region: Secondary | ICD-10-CM | POA: Diagnosis not present

## 2022-09-26 DIAGNOSIS — Z794 Long term (current) use of insulin: Secondary | ICD-10-CM | POA: Diagnosis not present

## 2022-09-26 DIAGNOSIS — R269 Unspecified abnormalities of gait and mobility: Secondary | ICD-10-CM | POA: Diagnosis not present

## 2022-09-26 DIAGNOSIS — M5441 Lumbago with sciatica, right side: Secondary | ICD-10-CM | POA: Diagnosis not present

## 2022-09-26 DIAGNOSIS — I10 Essential (primary) hypertension: Secondary | ICD-10-CM | POA: Diagnosis not present

## 2022-09-27 DIAGNOSIS — M5416 Radiculopathy, lumbar region: Secondary | ICD-10-CM | POA: Diagnosis not present

## 2022-09-27 DIAGNOSIS — M5136 Other intervertebral disc degeneration, lumbar region: Secondary | ICD-10-CM | POA: Diagnosis not present

## 2022-10-04 ENCOUNTER — Other Ambulatory Visit: Payer: Self-pay | Admitting: Family Medicine

## 2022-10-04 DIAGNOSIS — M5416 Radiculopathy, lumbar region: Secondary | ICD-10-CM

## 2022-10-20 DIAGNOSIS — M159 Polyosteoarthritis, unspecified: Secondary | ICD-10-CM | POA: Diagnosis not present

## 2022-10-20 DIAGNOSIS — M7711 Lateral epicondylitis, right elbow: Secondary | ICD-10-CM | POA: Diagnosis not present

## 2022-10-20 DIAGNOSIS — Z79899 Other long term (current) drug therapy: Secondary | ICD-10-CM | POA: Diagnosis not present

## 2022-10-20 DIAGNOSIS — M329 Systemic lupus erythematosus, unspecified: Secondary | ICD-10-CM | POA: Diagnosis not present

## 2022-12-05 DIAGNOSIS — B351 Tinea unguium: Secondary | ICD-10-CM | POA: Diagnosis not present

## 2022-12-05 DIAGNOSIS — E1142 Type 2 diabetes mellitus with diabetic polyneuropathy: Secondary | ICD-10-CM | POA: Diagnosis not present

## 2022-12-05 DIAGNOSIS — L851 Acquired keratosis [keratoderma] palmaris et plantaris: Secondary | ICD-10-CM | POA: Diagnosis not present

## 2022-12-26 ENCOUNTER — Other Ambulatory Visit: Payer: Self-pay | Admitting: Internal Medicine

## 2022-12-26 DIAGNOSIS — Z1231 Encounter for screening mammogram for malignant neoplasm of breast: Secondary | ICD-10-CM

## 2023-01-06 DIAGNOSIS — E119 Type 2 diabetes mellitus without complications: Secondary | ICD-10-CM | POA: Diagnosis not present

## 2023-01-06 DIAGNOSIS — H2513 Age-related nuclear cataract, bilateral: Secondary | ICD-10-CM | POA: Diagnosis not present

## 2023-01-06 DIAGNOSIS — H34831 Tributary (branch) retinal vein occlusion, right eye, with macular edema: Secondary | ICD-10-CM | POA: Diagnosis not present

## 2023-01-06 DIAGNOSIS — H25041 Posterior subcapsular polar age-related cataract, right eye: Secondary | ICD-10-CM | POA: Diagnosis not present

## 2023-01-18 ENCOUNTER — Ambulatory Visit
Admission: RE | Admit: 2023-01-18 | Discharge: 2023-01-18 | Disposition: A | Discharge status: Home / Self Care | Payer: No Typology Code available for payment source | Source: Ambulatory Visit | Attending: Internal Medicine | Admitting: Internal Medicine

## 2023-01-18 DIAGNOSIS — Z1231 Encounter for screening mammogram for malignant neoplasm of breast: Secondary | ICD-10-CM | POA: Diagnosis not present

## 2023-01-23 DIAGNOSIS — H25041 Posterior subcapsular polar age-related cataract, right eye: Secondary | ICD-10-CM | POA: Diagnosis not present

## 2023-01-23 DIAGNOSIS — H2511 Age-related nuclear cataract, right eye: Secondary | ICD-10-CM | POA: Diagnosis not present

## 2023-02-09 DIAGNOSIS — E1142 Type 2 diabetes mellitus with diabetic polyneuropathy: Secondary | ICD-10-CM | POA: Diagnosis not present

## 2023-02-10 DIAGNOSIS — E1100 Type 2 diabetes mellitus with hyperosmolarity without nonketotic hyperglycemic-hyperosmolar coma (NKHHC): Secondary | ICD-10-CM | POA: Diagnosis not present

## 2023-02-10 DIAGNOSIS — Z794 Long term (current) use of insulin: Secondary | ICD-10-CM | POA: Diagnosis not present

## 2023-02-10 DIAGNOSIS — I10 Essential (primary) hypertension: Secondary | ICD-10-CM | POA: Diagnosis not present

## 2023-02-10 DIAGNOSIS — I480 Paroxysmal atrial fibrillation: Secondary | ICD-10-CM | POA: Diagnosis not present

## 2023-02-10 DIAGNOSIS — E039 Hypothyroidism, unspecified: Secondary | ICD-10-CM | POA: Diagnosis not present

## 2023-02-10 DIAGNOSIS — Z0000 Encounter for general adult medical examination without abnormal findings: Secondary | ICD-10-CM | POA: Diagnosis not present

## 2023-02-23 ENCOUNTER — Encounter: Payer: Self-pay | Admitting: Ophthalmology

## 2023-02-23 NOTE — Anesthesia Preprocedure Evaluation (Addendum)
Anesthesia Evaluation  Patient identified by MRN, date of birth, ID band Patient awake    Reviewed: Allergy & Precautions, NPO status , Patient's Chart, lab work & pertinent test results  History of Anesthesia Complications (+) PONV and history of anesthetic complications  Airway Mallampati: II  TM Distance: >3 FB Neck ROM: Full    Dental  (+) Lower Dentures   Pulmonary asthma , neg sleep apnea   breath sounds clear to auscultation- rhonchi (-) wheezing      Cardiovascular hypertension, Pt. on medications (-) angina (-) CAD, (-) Past MI, (-) Cardiac Stents and (-) CABG + dysrhythmias Atrial Fibrillation  Rhythm:Regular Rate:Normal - Systolic murmurs and - Diastolic murmurs   Patient hypertensive to 200s/70s in preop. Asymptomatic. Last took valsartan and diuretic yesterday. Last clinic visit BP was 130s/70s.   Neuro/Psych neg Seizures negative neurological ROS  negative psych ROS   GI/Hepatic Neg liver ROS,GERD  ,,  Endo/Other  diabetes, Insulin Dependent    Renal/GU negative Renal ROS     Musculoskeletal negative musculoskeletal ROS (+)    Abdominal  (+) + obese  Peds  Hematology negative hematology ROS (+)   Anesthesia Other Findings PONV (postoperative nausea and vomiting)  Hypertension PAF (paroxysmal atrial fibrillation)  Asthma History of 2019 novel coronavirus disease (COVID-19)  T2DM (type 2 diabetes mellitus)  Lupus (HCC)  Coronary artery disease HLD (hyperlipidemia)  Anginal pain (HCC) Chronic anticoagulation  GERD (gastroesophageal reflux disease) Osteoporosis  Grade I diastolic dysfunction Long term current use of immunosuppressive drug Hypothyroidism Uses walker  Wears dentures    Reproductive/Obstetrics                              Anesthesia Physical Anesthesia Plan  ASA: 3  Anesthesia Plan: MAC   Post-op Pain Management:    Induction:  Intravenous  PONV Risk Score and Plan: 2 and Midazolam  Airway Management Planned: Nasal Cannula  Additional Equipment:   Intra-op Plan:   Post-operative Plan:   Informed Consent: I have reviewed the patients History and Physical, chart, labs and discussed the procedure including the risks, benefits and alternatives for the proposed anesthesia with the patient or authorized representative who has indicated his/her understanding and acceptance.       Plan Discussed with: CRNA and Surgeon  Anesthesia Plan Comments: (I advised patient her current blood pressure is quite high for an elective procedure. Given that she has a known history of HTN and being treated, and normally has documented good control, we adminstered 5mg  of labetalol IV with excellent effect. OK to proceed. Explained risks of anesthesia, including PONV, and rare emergencies such as cardiac events, respiratory problems, and allergic reactions, requiring invasive intervention. Discussed the role of CRNA in patient's perioperative care. Patient understands. )         Anesthesia Quick Evaluation

## 2023-02-27 NOTE — Discharge Instructions (Signed)
El cuidado despu?s de una operaci?n de cataratas ?Cataract Surgery, Care After (Spanish) ? ?Esta hoja le da informaci?n sobre c?mo cuidarse despu?s de su cirug?a. Su oftalm?logo puede darle instrucciones m?s espec?ficas tambi?n. Si tiene problemas o preguntas, comun?quese con su doctor en el [facility], [phone]. ? ??Qu? puedo esperar despu?s de la cirug?a? ?Es normal tener: ?Picaz?n ?Sensaci?n de tener un cuerpo extra?o (se siente como un grano de arena en el ojo) ?Secreci?n acuosa (lagrimeo excesivo) ?Sensibilidad a la luz y al tacto ?Moretones en el ojo o a su alrededor ?Visi?n ligeramente borrosa ? ?Siga estas instrucciones en casa: ?No se toque ni se frote los ojos. ?Es posible que le digan que use una pantalla protectora o lentes de sol para proteger sus ojos. ?No se ponga un lente de contacto en el ojo operado hasta que su doctor lo apruebe. ?Mantenga los p?rpados y la cara limpios y secos. ?No deje que el agua le caiga directamente en la cara mientras se duche. ?Evite el jab?n y champ? en los ojos. ?No se maquille los ojos por una semana. ? ?Rev?sese su ojo todos los d?as para ver si hay  signos de infecci?n. Est? atento(a): ?Enrojecimiento, hinchaz?n o dolor. ?L?quido, sangre o pus. ?Empeoramiento de la visi?n. ?Aumento de la sensibilidad a la luz o al tacto. ? ?Actividad: ?Durante el primer d?a, evite agacharse y leer. Puede volver a leer y a agacharse al d?a siguiente. ?No maneje ni use maquinaria pesada durante al menos 24 horas. ?Evite las actividades vigorosas durante una semana. Est? bien realizar actividades como caminar, usar la cinta de correr, usar la bicicleta est?tica y subir las escaleras. ?No levante objetos pesados (m?s de 20 libras) por una semana. ?No realice trabajos de jardiner?a ni tareas dom?sticas sucias en la casa (como limpiar los pisos, los ba?os, pasar la aspiradora, etc.) durante una semana. ?No nade ni use un jacuzzi durante 2 semanas.  ?Pregunte a su doctor cu?ndo  usted puede volver a trabajar. ? ?Instrucciones generales: ?Tome o aplique los medicamentos recetados y de venta libre seg?n le indique su doctor, incluyendo las gotas para los ojos y las pomadas. ?Contin?e tomando los medicamentos que fueron suspendidos antes de la cirug?a, a menos que su doctor le indique lo contrario. ?Vaya a todas sus citas de seguimiento que ya fueron programadas. ? ? ?P?ngase en contacto con un proveedor de atenci?n m?dica si: ?Le aparecen m?s moretones alrededor del ojo. ?Tiene dolor que no se alivia con el medicamento. ?Tiene fiebre. ?Le sale l?quido, pus o sangre del ojo o de la incisi?n. ?Su sensibilidad a la luz empeora. ?Tiene manchas (moscas volantes) o destellos de luz en la vista. ?Tiene n?useas o v?mitos. ? ?Vaya al [facility] m?s cercana o llame al 911 si: ?Pierde la vista de forma repentina. ?Tiene un dolor de ojo que es intenso y que se est? empeorando. ?

## 2023-03-01 ENCOUNTER — Encounter
Admission: RE | Disposition: A | Discharge status: Home / Self Care | Payer: Self-pay | Source: Home / Self Care | Attending: Ophthalmology

## 2023-03-01 ENCOUNTER — Ambulatory Visit
Admission: RE | Admit: 2023-03-01 | Discharge: 2023-03-01 | Disposition: A | Discharge status: Home / Self Care | Payer: No Typology Code available for payment source | Attending: Ophthalmology | Admitting: Ophthalmology

## 2023-03-01 ENCOUNTER — Ambulatory Visit: Payer: No Typology Code available for payment source | Admitting: Anesthesiology

## 2023-03-01 ENCOUNTER — Other Ambulatory Visit: Payer: Self-pay

## 2023-03-01 ENCOUNTER — Encounter: Payer: Self-pay | Admitting: Ophthalmology

## 2023-03-01 DIAGNOSIS — E1136 Type 2 diabetes mellitus with diabetic cataract: Secondary | ICD-10-CM | POA: Insufficient documentation

## 2023-03-01 DIAGNOSIS — H2511 Age-related nuclear cataract, right eye: Secondary | ICD-10-CM | POA: Insufficient documentation

## 2023-03-01 DIAGNOSIS — I10 Essential (primary) hypertension: Secondary | ICD-10-CM | POA: Insufficient documentation

## 2023-03-01 DIAGNOSIS — K219 Gastro-esophageal reflux disease without esophagitis: Secondary | ICD-10-CM | POA: Diagnosis not present

## 2023-03-01 DIAGNOSIS — Z794 Long term (current) use of insulin: Secondary | ICD-10-CM | POA: Diagnosis not present

## 2023-03-01 DIAGNOSIS — I480 Paroxysmal atrial fibrillation: Secondary | ICD-10-CM | POA: Insufficient documentation

## 2023-03-01 HISTORY — DX: Presence of dental prosthetic device (complete) (partial): Z97.2

## 2023-03-01 HISTORY — PX: CATARACT EXTRACTION W/PHACO: SHX586

## 2023-03-01 HISTORY — DX: Dependence on other enabling machines and devices: Z99.89

## 2023-03-01 HISTORY — DX: Hypothyroidism, unspecified: E03.9

## 2023-03-01 LAB — GLUCOSE, CAPILLARY: Glucose-Capillary: 94 mg/dL (ref 70–99)

## 2023-03-01 SURGERY — PHACOEMULSIFICATION, CATARACT, WITH IOL INSERTION
Anesthesia: Monitor Anesthesia Care | Laterality: Right

## 2023-03-01 MED ORDER — LACTATED RINGERS IV SOLN: INTRAVENOUS | Status: DC

## 2023-03-01 MED ORDER — TETRACAINE HCL 0.5 % OP SOLN
1.0000 [drp] | OPHTHALMIC | Status: DC | PRN
  Administered 2023-03-01 (×3): 1 [drp] via OPHTHALMIC

## 2023-03-01 MED ORDER — SIGHTPATH DOSE#1 BSS IO SOLN
INTRAOCULAR | Status: DC | PRN
  Administered 2023-03-01: 80 mL via OPHTHALMIC

## 2023-03-01 MED ORDER — BRIMONIDINE TARTRATE-TIMOLOL 0.2-0.5 % OP SOLN
OPHTHALMIC | Status: DC | PRN
  Administered 2023-03-01: 1 [drp] via OPHTHALMIC

## 2023-03-01 MED ORDER — TRYPAN BLUE 0.06 % IO SOSY
PREFILLED_SYRINGE | INTRAOCULAR | Status: DC | PRN
  Administered 2023-03-01: .5 mL via INTRAOCULAR

## 2023-03-01 MED ORDER — LIDOCAINE HCL (PF) 2 % IJ SOLN
INTRAOCULAR | Status: DC | PRN
  Administered 2023-03-01: 4 mL via INTRAOCULAR

## 2023-03-01 MED ORDER — SIGHTPATH DOSE#1 NA HYALUR & NA CHOND-NA HYALUR IO KIT
PACK | INTRAOCULAR | Status: DC | PRN
  Administered 2023-03-01: 1 via OPHTHALMIC

## 2023-03-01 MED ORDER — FENTANYL CITRATE (PF) 100 MCG/2ML IJ SOLN
INTRAMUSCULAR | Status: DC | PRN
  Administered 2023-03-01: 50 ug via INTRAVENOUS

## 2023-03-01 MED ORDER — SIGHTPATH DOSE#1 BSS IO SOLN
INTRAOCULAR | Status: DC | PRN
  Administered 2023-03-01: 15 mL via INTRAOCULAR

## 2023-03-01 MED ORDER — ARMC OPHTHALMIC DILATING DROPS
1.0000 | OPHTHALMIC | Status: DC | PRN
  Administered 2023-03-01 (×3): 1 via OPHTHALMIC

## 2023-03-01 MED ORDER — MOXIFLOXACIN HCL 0.5 % OP SOLN
OPHTHALMIC | Status: DC | PRN
  Administered 2023-03-01: .2 mL via OPHTHALMIC

## 2023-03-01 MED ORDER — ONDANSETRON HCL 4 MG/2ML IJ SOLN
INTRAMUSCULAR | Status: DC | PRN
  Administered 2023-03-01: 4 mg via INTRAVENOUS

## 2023-03-01 MED ORDER — LABETALOL HCL 5 MG/ML IV SOLN
5.0000 mg | Freq: Once | INTRAVENOUS | Status: AC
  Administered 2023-03-01: 5 mg via INTRAVENOUS

## 2023-03-01 MED ORDER — MIDAZOLAM HCL 2 MG/2ML IJ SOLN
INTRAMUSCULAR | Status: DC | PRN
  Administered 2023-03-01: 1 mg via INTRAVENOUS

## 2023-03-01 SURGICAL SUPPLY — 12 items
CANNULA ANT/CHMB 27G (MISCELLANEOUS) IMPLANT
CANNULA ANT/CHMB 27GA (MISCELLANEOUS) ×1 IMPLANT
CATARACT SUITE SIGHTPATH (MISCELLANEOUS) ×1 IMPLANT
DISSECTOR HYDRO NUCLEUS 50X22 (MISCELLANEOUS) ×1 IMPLANT
DRSG TEGADERM 2-3/8X2-3/4 SM (GAUZE/BANDAGES/DRESSINGS) ×1 IMPLANT
FEE CATARACT SUITE SIGHTPATH (MISCELLANEOUS) ×1 IMPLANT
GLOVE SURG SYN 7.5 E (GLOVE) ×1 IMPLANT
GLOVE SURG SYN 7.5 PF PI (GLOVE) ×1 IMPLANT
GLOVE SURG SYN 8.5 E (GLOVE) ×1 IMPLANT
GLOVE SURG SYN 8.5 PF PI (GLOVE) ×1 IMPLANT
LENS IOL TECNIS EYHANCE 23.0 (Intraocular Lens) IMPLANT
RING MALYGIN 7.0 (MISCELLANEOUS) IMPLANT

## 2023-03-01 NOTE — Op Note (Signed)
OPERATIVE NOTE  Janice Cook [id number] 03/01/2023   PREOPERATIVE DIAGNOSIS: Nuclear sclerotic cataract right eye. H25.11   POSTOPERATIVE DIAGNOSIS: Nuclear sclerotic cataract right eye. H25.11   PROCEDURE:  Phacoemusification with posterior chamber intraocular lens placement of the right eye  Ultrasound time: Procedure(s) with comments: CATARACT EXTRACTION PHACO AND INTRAOCULAR LENS PLACEMENT (IOC) RIGHT MALYUGIN VISION BLUE DIABETIC 18.89 01:33.5 (Right) - Latex Diabetic  LENS:   Implant Name Type Inv. Item Serial No. Manufacturer Lot No. LRB No. Used Action  LENS IOL TECNIS EYHANCE 23.0 - [id number] Intraocular Lens LENS IOL TECNIS EYHANCE 23.0 [id number] [employer]  Right 1 Implanted      SURGEON:  Julious Payer. Rolley Sims, MD   ANESTHESIA:  Topical with tetracaine drops, augmented with 1% preservative-free intracameral lidocaine.   COMPLICATIONS:  None.   DESCRIPTION OF PROCEDURE:  The patient was identified in the holding room and transported to the operating room and placed in the supine position under the operating microscope.  The right eye was identified as the operative eye, which was prepped and draped in the usual sterile ophthalmic fashion.   A 1 millimeter clear-corneal paracentesis was made superotemporally. Preservative-free 1% lidocaine mixed with 1:1,000 bisulfite-free aqueous solution of epinephrine was injected into the anterior chamber. There was no red reflex so Trypan blue was injected intracamerally to stain the anterior capsule and facilitate safe creation of the capsulorrhexis. The anterior chamber was then filled with Viscoat viscoelastic. A 2.4 millimeter keratome was used to make a clear-corneal incision inferotemporally. The pupil remained miotic so a 7.00 mm Malyugin ring was injected to expand the pupil. A curvilinear capsulorrhexis was made with a cystotome and capsulorrhexis forceps. Balanced salt solution was used to hydrodissect and hydrodelineate the  nucleus. Phacoemulsification was then used to remove the lens nucleus and epinucleus. The remaining cortex was then removed using the irrigation and aspiration handpiece. Provisc was then placed into the capsular bag to distend it for lens placement. A +23.00 D DIB00 intraocular lens was then injected into the capsular bag. The Malyugin ring was removed. The remaining viscoelastic was aspirated.   Wounds were hydrated with balanced salt solution.  The anterior chamber was inflated to a physiologic pressure with balanced salt solution.  No wound leaks were noted. Vigamox was injected intracamerally.  Timolol and Brimonidine drops were applied to the eye.  The patient was taken to the recovery room in stable condition without complications of anesthesia or surgery.  Rolly Pancake [place] 03/01/2023, 8:40 AM

## 2023-03-01 NOTE — H&P (Signed)
[facility]   Primary Care Physician:  [facility] Ophthalmologist: Dr. Deberah Pelton  Pre-Procedure History & Physical: HPI:  Janice Cook is a 77 y.o. female here for cataract surgery.   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   Lupus (HCC)    Osteoporosis    PAF (paroxysmal atrial fibrillation) (HCC)    PONV (postoperative nausea and vomiting)    T2DM (type 2 diabetes mellitus) (HCC)    Uses walker    Wears dentures    partial upper    Past Surgical History:  Procedure Laterality Date   CARDIAC CATHETERIZATION Left 05/24/2012   Normal coronaries; LVEF 60%; LVEDP 16; Location: [facility]; Surgeon: Terrence Dupont, MD   CHOLECYSTECTOMY     SALPINGOOPHORECTOMY Left    age 46 in [place]   SHOULDER ARTHROSCOPY WITH SUBACROMIAL DECOMPRESSION, ROTATOR CUFF REPAIR AND BICEP TENDON REPAIR Right 05/11/2021   Procedure: Extensive arthroscopic debridement, arthroscopic subscapularis tendon repair, arthroscopic subacromial decompression, and mini-open repair of supraspinatus tendon tear, right shoulder;  Surgeon: Christena Flake, MD;  Location: [facility];  Service: Orthopedics;  Laterality: Right;   SPLENECTOMY, TOTAL      Prior to Admission medications   Medication Sig Start Date End Date Taking? Authorizing Provider  albuterol (VENTOLIN HFA) 108 (90 Base) MCG/ACT inhaler Inhale into the lungs every 6 (six) hours as needed for wheezing or shortness of breath.   Yes [provider]  alendronate  (FOSAMAX) 70 MG tablet Take 70 mg by mouth once a week. Take with a full glass of water on an empty stomach.   Yes [provider]  amiodarone (PACERONE) 200 MG tablet Take 200 mg by mouth daily. 01/19/21  Yes [provider]  apixaban (ELIQUIS) 5 MG TABS tablet Take 5 mg by mouth 2 (two) times daily. 02/17/20  Yes [provider]  DULoxetine (CYMBALTA) 20 MG capsule Take 40 mg by mouth daily.   Yes [provider]  fenofibrate 160 MG tablet Take 160 mg by mouth daily. 02/24/20  Yes [provider]  fluticasone (FLONASE) 50 MCG/ACT nasal spray Place 2 sprays into both nostrils daily as needed for allergies.   Yes [provider]  fluticasone-salmeterol (WIXELA INHUB) 250-50 MCG/ACT AEPB Inhale 1 puff into the lungs in the morning and at bedtime.   Yes [provider]  folic acid (FOLVITE) 1 MG tablet Take 1 mg by mouth daily.   Yes [provider]  furosemide (LASIX) 20 MG tablet Take 20 mg by mouth daily. 02/24/20  Yes [provider]  gabapentin (NEURONTIN) 300 MG capsule Take 300 mg by mouth 2 (two) times daily.   Yes [provider]  insulin glargine (LANTUS) 100 UNIT/ML Solostar Pen Inject 8 Units into the skin at bedtime. Pt takes once daily at different times depending on her glucose   Yes [provider]  levothyroxine (SYNTHROID) 50 MCG tablet Take 50 mcg by mouth daily before  breakfast.   Yes [provider]  meloxicam (MOBIC) 15 MG tablet Take 15 mg by mouth daily as needed for pain.   Yes [provider]  methotrexate (RHEUMATREX) 2.5 MG tablet Take 10 mg by mouth once a week. Caution:Chemotherapy. Protect from light.   Yes [provider]  montelukast (SINGULAIR) 10 MG tablet Take 10 mg by mouth at bedtime. 02/24/20  Yes [provider]  mycophenolate (CELLCEPT) 250 MG capsule Take 250 mg by mouth 2 (two) times daily. 01/02/20  Yes [provider]   omeprazole (PRILOSEC) 20 MG capsule Take 20 mg by mouth daily.   Yes [provider]  oxyCODONE (ROXICODONE) 5 MG immediate release tablet Take 1-2 tablets (5-10 mg total) by mouth every 4 (four) hours as needed for moderate pain or severe pain. 05/11/21  Yes Poggi, Excell Seltzer, MD  predniSONE (DELTASONE) 1 MG tablet Take 3 mg by mouth daily. 01/02/20  Yes [provider]  rosuvastatin (CRESTOR) 40 MG tablet Take 40 mg by mouth daily. 02/24/20  Yes [provider]  sotalol (BETAPACE) 80 MG tablet Take 80 mg by mouth 3 (three) times daily.   Yes [provider]  traMADol (ULTRAM) 50 MG tablet Take 50 mg by mouth every 6 (six) hours as needed.   Yes [provider]  valsartan (DIOVAN) 80 MG tablet Take 80 mg by mouth daily. 04/09/21  Yes [provider]  potassium chloride SA (KLOR-CON) 20 MEQ tablet Take 20 mEq by mouth daily. Patient not taking: Reported on 02/23/2023 02/24/20   [provider]    Allergies as of 01/24/2023 - Review Complete 05/11/2021  Allergen Reaction Noted   Aspirin Other (See Comments) 12/19/2019   Nitroglycerin Other (See Comments) 01/02/2020   Other Other (See Comments) 01/02/2020   Sulfa antibiotics Itching, Rash, and Other (See Comments) 12/19/2019   Propofol Nausea And Vomiting 01/02/2020   Latex Rash 04/10/2012    Family History  Problem Relation Age of Onset   Breast cancer Sister 59   Breast cancer Maternal Aunt 74   Breast cancer Cousin 40    Social History   Socioeconomic History   Marital status: Married    Spouse name: Not on file   Number of children: Not on file   Years of education: Not on file   Highest education level: Not on file  Occupational History   Not on file  Tobacco Use   Smoking status: Never   Smokeless tobacco: Never  Vaping Use   Vaping Use: Never used  Substance and Sexual Activity   Alcohol use: Never   Drug use: Never   Sexual activity: Not on file  Other Topics  Concern   Not on file  Social History Narrative   Not on file   Social Determinants of Health   Financial Resource Strain: Not on file  Food Insecurity: Not on file  Transportation Needs: Not on file  Physical Activity: Not on file  Stress: Not on file  Social Connections: Not on file  Intimate Partner Violence: Not on file    Review of Systems: See HPI, otherwise negative ROS  Physical Exam: Ht 4\' 9"  (1.448 m)   Wt 67.6 kg   BMI 32.24 kg/m  General:   Alert, cooperative in NAD Head:  Normocephalic and atraumatic. Respiratory:  Normal work of breathing. Cardiovascular:  RRR  Impression/Plan: Janice Cook is here for cataract surgery.  Risks, benefits, limitations, and alternatives regarding cataract surgery have been reviewed  with the patient.  Questions have been answered.  All parties agreeable.   Estanislado Pandy, MD  03/01/2023, 7:03 AM

## 2023-03-01 NOTE — Transfer of Care (Signed)
Immediate Anesthesia Transfer of Care Note  Patient: Janice Cook  Procedure(s) Performed: CATARACT EXTRACTION PHACO AND INTRAOCULAR LENS PLACEMENT (IOC) RIGHT MALYUGIN VISION BLUE DIABETIC 18.89 01:33.5 (Right)  Patient Location: PACU  Anesthesia Type: MAC  Level of Consciousness: awake, alert  and patient cooperative  Airway and Oxygen Therapy: Patient Spontanous Breathing and Patient connected to supplemental oxygen  Post-op Assessment: Post-op Vital signs reviewed, Patient's Cardiovascular Status Stable, Respiratory Function Stable, Patent Airway and No signs of Nausea or vomiting  Post-op Vital Signs: Reviewed and stable  Complications: No notable events documented.

## 2023-03-01 NOTE — Anesthesia Postprocedure Evaluation (Signed)
Anesthesia Post Note  Patient: Janice Cook  Procedure(s) Performed: CATARACT EXTRACTION PHACO AND INTRAOCULAR LENS PLACEMENT (IOC) RIGHT MALYUGIN VISION BLUE DIABETIC 18.89 01:33.5 (Right)  Patient location during evaluation: PACU Anesthesia Type: MAC Level of consciousness: awake and alert Pain management: pain level controlled Vital Signs Assessment: post-procedure vital signs reviewed and stable Respiratory status: spontaneous breathing, nonlabored ventilation, respiratory function stable and patient connected to nasal cannula oxygen Cardiovascular status: stable and blood pressure returned to baseline Postop Assessment: no apparent nausea or vomiting Anesthetic complications: no   No notable events documented.   Last Vitals:  Vitals:   03/01/23 0842 03/01/23 0845  BP: (!) 150/73 (!) 147/67  Pulse: 73 78  Resp: (!) 9 15  Temp: (!) 36.1 C (!) 36.1 C  SpO2: 100% 97%    Last Pain:  Vitals:   03/01/23 0845  TempSrc:   PainSc: 0-No pain                 Corinda Gubler

## 2023-03-02 ENCOUNTER — Encounter: Payer: Self-pay | Admitting: Ophthalmology

## 2023-03-06 DIAGNOSIS — M329 Systemic lupus erythematosus, unspecified: Secondary | ICD-10-CM | POA: Diagnosis not present

## 2023-03-06 DIAGNOSIS — M159 Polyosteoarthritis, unspecified: Secondary | ICD-10-CM | POA: Diagnosis not present

## 2023-03-06 DIAGNOSIS — M810 Age-related osteoporosis without current pathological fracture: Secondary | ICD-10-CM | POA: Diagnosis not present

## 2023-03-06 DIAGNOSIS — Z79899 Other long term (current) drug therapy: Secondary | ICD-10-CM | POA: Diagnosis not present

## 2023-03-17 DIAGNOSIS — E063 Autoimmune thyroiditis: Secondary | ICD-10-CM | POA: Diagnosis not present

## 2023-03-17 DIAGNOSIS — M810 Age-related osteoporosis without current pathological fracture: Secondary | ICD-10-CM | POA: Diagnosis not present

## 2023-03-28 DIAGNOSIS — H34831 Tributary (branch) retinal vein occlusion, right eye, with macular edema: Secondary | ICD-10-CM | POA: Diagnosis not present

## 2023-03-28 DIAGNOSIS — H4322 Crystalline deposits in vitreous body, left eye: Secondary | ICD-10-CM | POA: Diagnosis not present

## 2023-03-28 DIAGNOSIS — E119 Type 2 diabetes mellitus without complications: Secondary | ICD-10-CM | POA: Diagnosis not present

## 2023-03-28 DIAGNOSIS — H40059 Ocular hypertension, unspecified eye: Secondary | ICD-10-CM | POA: Diagnosis not present

## 2023-04-21 DIAGNOSIS — I480 Paroxysmal atrial fibrillation: Secondary | ICD-10-CM | POA: Diagnosis not present

## 2023-04-21 DIAGNOSIS — Z794 Long term (current) use of insulin: Secondary | ICD-10-CM | POA: Diagnosis not present

## 2023-04-21 DIAGNOSIS — E063 Autoimmune thyroiditis: Secondary | ICD-10-CM | POA: Diagnosis not present

## 2023-04-21 DIAGNOSIS — I10 Essential (primary) hypertension: Secondary | ICD-10-CM | POA: Diagnosis not present

## 2023-04-21 DIAGNOSIS — R0602 Shortness of breath: Secondary | ICD-10-CM | POA: Diagnosis not present

## 2023-04-21 DIAGNOSIS — E1100 Type 2 diabetes mellitus with hyperosmolarity without nonketotic hyperglycemic-hyperosmolar coma (NKHHC): Secondary | ICD-10-CM | POA: Diagnosis not present

## 2023-04-21 DIAGNOSIS — E039 Hypothyroidism, unspecified: Secondary | ICD-10-CM | POA: Diagnosis not present

## 2023-04-21 DIAGNOSIS — I517 Cardiomegaly: Secondary | ICD-10-CM | POA: Diagnosis not present

## 2023-04-21 DIAGNOSIS — J849 Interstitial pulmonary disease, unspecified: Secondary | ICD-10-CM | POA: Diagnosis not present

## 2023-04-21 DIAGNOSIS — R0789 Other chest pain: Secondary | ICD-10-CM | POA: Diagnosis not present

## 2023-05-02 DIAGNOSIS — H34831 Tributary (branch) retinal vein occlusion, right eye, with macular edema: Secondary | ICD-10-CM | POA: Diagnosis not present

## 2023-05-18 DIAGNOSIS — E782 Mixed hyperlipidemia: Secondary | ICD-10-CM | POA: Diagnosis not present

## 2023-05-18 DIAGNOSIS — R0602 Shortness of breath: Secondary | ICD-10-CM | POA: Diagnosis not present

## 2023-05-18 DIAGNOSIS — I480 Paroxysmal atrial fibrillation: Secondary | ICD-10-CM | POA: Diagnosis not present

## 2023-05-18 DIAGNOSIS — I10 Essential (primary) hypertension: Secondary | ICD-10-CM | POA: Diagnosis not present

## 2023-05-18 DIAGNOSIS — I2510 Atherosclerotic heart disease of native coronary artery without angina pectoris: Secondary | ICD-10-CM | POA: Diagnosis not present

## 2023-06-07 DIAGNOSIS — H40059 Ocular hypertension, unspecified eye: Secondary | ICD-10-CM | POA: Diagnosis not present

## 2023-06-07 DIAGNOSIS — Z961 Presence of intraocular lens: Secondary | ICD-10-CM | POA: Diagnosis not present

## 2023-06-27 DIAGNOSIS — E063 Autoimmune thyroiditis: Secondary | ICD-10-CM | POA: Diagnosis not present

## 2023-06-30 DIAGNOSIS — E063 Autoimmune thyroiditis: Secondary | ICD-10-CM | POA: Diagnosis not present

## 2023-06-30 DIAGNOSIS — M810 Age-related osteoporosis without current pathological fracture: Secondary | ICD-10-CM | POA: Diagnosis not present

## 2023-07-10 DIAGNOSIS — M216X1 Other acquired deformities of right foot: Secondary | ICD-10-CM | POA: Diagnosis not present

## 2023-07-10 DIAGNOSIS — M2042 Other hammer toe(s) (acquired), left foot: Secondary | ICD-10-CM | POA: Diagnosis not present

## 2023-07-10 DIAGNOSIS — M7742 Metatarsalgia, left foot: Secondary | ICD-10-CM | POA: Diagnosis not present

## 2023-07-10 DIAGNOSIS — M79675 Pain in left toe(s): Secondary | ICD-10-CM | POA: Diagnosis not present

## 2023-07-10 DIAGNOSIS — M7741 Metatarsalgia, right foot: Secondary | ICD-10-CM | POA: Diagnosis not present

## 2023-07-10 DIAGNOSIS — B351 Tinea unguium: Secondary | ICD-10-CM | POA: Diagnosis not present

## 2023-07-10 DIAGNOSIS — M216X2 Other acquired deformities of left foot: Secondary | ICD-10-CM | POA: Diagnosis not present

## 2023-07-10 DIAGNOSIS — M79674 Pain in right toe(s): Secondary | ICD-10-CM | POA: Diagnosis not present

## 2023-07-10 DIAGNOSIS — M2041 Other hammer toe(s) (acquired), right foot: Secondary | ICD-10-CM | POA: Diagnosis not present

## 2023-07-10 DIAGNOSIS — E1142 Type 2 diabetes mellitus with diabetic polyneuropathy: Secondary | ICD-10-CM | POA: Diagnosis not present

## 2023-07-10 DIAGNOSIS — M792 Neuralgia and neuritis, unspecified: Secondary | ICD-10-CM | POA: Diagnosis not present

## 2023-07-10 DIAGNOSIS — L851 Acquired keratosis [keratoderma] palmaris et plantaris: Secondary | ICD-10-CM | POA: Diagnosis not present

## 2023-07-10 DIAGNOSIS — M14672 Charcot's joint, left ankle and foot: Secondary | ICD-10-CM | POA: Diagnosis not present

## 2023-07-11 DIAGNOSIS — M47816 Spondylosis without myelopathy or radiculopathy, lumbar region: Secondary | ICD-10-CM | POA: Diagnosis not present

## 2023-07-11 DIAGNOSIS — M150 Primary generalized (osteo)arthritis: Secondary | ICD-10-CM | POA: Diagnosis not present

## 2023-07-11 DIAGNOSIS — M329 Systemic lupus erythematosus, unspecified: Secondary | ICD-10-CM | POA: Diagnosis not present

## 2023-07-11 DIAGNOSIS — Z79899 Other long term (current) drug therapy: Secondary | ICD-10-CM | POA: Diagnosis not present

## 2023-07-19 DIAGNOSIS — R0602 Shortness of breath: Secondary | ICD-10-CM | POA: Diagnosis not present

## 2023-08-21 DIAGNOSIS — E039 Hypothyroidism, unspecified: Secondary | ICD-10-CM | POA: Diagnosis not present

## 2023-08-21 DIAGNOSIS — I10 Essential (primary) hypertension: Secondary | ICD-10-CM | POA: Diagnosis not present

## 2023-08-21 DIAGNOSIS — Z794 Long term (current) use of insulin: Secondary | ICD-10-CM | POA: Diagnosis not present

## 2023-08-21 DIAGNOSIS — Z23 Encounter for immunization: Secondary | ICD-10-CM | POA: Diagnosis not present

## 2023-08-21 DIAGNOSIS — E1100 Type 2 diabetes mellitus with hyperosmolarity without nonketotic hyperglycemic-hyperosmolar coma (NKHHC): Secondary | ICD-10-CM | POA: Diagnosis not present

## 2023-08-21 DIAGNOSIS — E063 Autoimmune thyroiditis: Secondary | ICD-10-CM | POA: Diagnosis not present

## 2023-12-12 ENCOUNTER — Other Ambulatory Visit: Payer: Self-pay | Admitting: Internal Medicine

## 2023-12-12 DIAGNOSIS — G8929 Other chronic pain: Secondary | ICD-10-CM

## 2023-12-15 ENCOUNTER — Ambulatory Visit
Admission: RE | Admit: 2023-12-15 | Discharge: 2023-12-15 | Disposition: A | Discharge status: Home / Self Care | Source: Ambulatory Visit | Attending: Internal Medicine | Admitting: Internal Medicine

## 2023-12-15 ENCOUNTER — Ambulatory Visit: Admission: RE | Admit: 2023-12-15 | Source: Ambulatory Visit

## 2023-12-15 DIAGNOSIS — M5441 Lumbago with sciatica, right side: Secondary | ICD-10-CM | POA: Diagnosis present

## 2023-12-15 DIAGNOSIS — G8929 Other chronic pain: Secondary | ICD-10-CM | POA: Diagnosis present

## 2024-01-16 ENCOUNTER — Inpatient Hospital Stay
Admission: RE | Admit: 2024-01-16 | Discharge: 2024-01-16 | Disposition: A | Discharge status: Home / Self Care | Payer: Self-pay | Source: Ambulatory Visit | Attending: Physician Assistant | Admitting: Physician Assistant

## 2024-01-16 ENCOUNTER — Other Ambulatory Visit: Payer: Self-pay | Admitting: Family Medicine

## 2024-01-16 DIAGNOSIS — Z049 Encounter for examination and observation for unspecified reason: Secondary | ICD-10-CM

## 2024-01-18 NOTE — Progress Notes (Deleted)
 Referring Physician:  Rex Castor, MD [address] [place],  [place] [address]  Primary Physician:  [facility]  History of Present Illness: 01/18/2024 [name] is here today with a chief complaint of ***  Back and right leg pain  Duration: *** Location: *** Quality: *** Severity: ***  Precipitating: aggravated by *** Modifying factors: made better by *** Weakness: none Timing: *** Bowel/Bladder Dysfunction: none  Conservative measures:  Physical therapy: *** has not participated in?? Multimodal medical therapy including regular antiinflammatories: *** cymbalta, gabapentin, meloxicam, tramadol Injections: *** no epidural steroid injections?  Past Surgery: ***no spinal surgeries  Janice Cook has ***no symptoms of cervical myelopathy.  The symptoms are causing a significant impact on the patient's life.   Review of Systems:  A 10 point review of systems is negative, except for the pertinent positives and negatives detailed in the HPI.  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   Lupus (HCC)    Osteoporosis    PAF (paroxysmal atrial fibrillation) (HCC)    PONV (postoperative nausea and vomiting)    T2DM (type 2 diabetes mellitus) (HCC)    Uses walker    Wears dentures    partial upper    Past Surgical History: Past Surgical History:  Procedure Laterality Date   CARDIAC CATHETERIZATION Left 05/24/2012   Normal coronaries; LVEF 60%;  LVEDP 16; Location: [facility]; Surgeon: Stevie Elder, MD   CATARACT EXTRACTION W/PHACO Right 03/01/2023   Procedure: CATARACT EXTRACTION PHACO AND INTRAOCULAR LENS PLACEMENT (IOC) RIGHT MALYUGIN VISION BLUE DIABETIC 18.89 01:33.5;  Surgeon: Trudi Fus, MD;  Location[facility];  Service: Ophthalmology;  Laterality: Right;  Latex Diabetic   CHOLECYSTECTOMY     SALPINGOOPHORECTOMY Left    age 71 in [place]   SHOULDER ARTHROSCOPY WITH SUBACROMIAL DECOMPRESSION, ROTATOR CUFF REPAIR AND BICEP TENDON REPAIR Right 05/11/2021   Procedure: Extensive arthroscopic debridement, arthroscopic subscapularis tendon repair, arthroscopic subacromial decompression, and mini-open repair of supraspinatus tendon tear, right shoulder;  Surgeon: Elner Hahn, MD;  Location: [facility];  Service: Orthopedics;  Laterality: Right;   SPLENECTOMY, TOTAL      Allergies: Allergies as of 01/19/2024 - Review Complete 03/01/2023  Allergen Reaction Noted   Aspirin Nausea And Vomiting 12/19/2019   Nitroglycerin Other (See Comments) 01/02/2020   Other Other (See Comments) 01/02/2020   Sulfa antibiotics Itching, Nausea And Vomiting, and Rash 12/19/2019   Lactose intolerance (gi) Nausea And Vomiting 02/23/2023   Meat extract Nausea And Vomiting 02/23/2023   Propofol  Nausea And Vomiting 01/02/2020   Hydroxyzine Swelling and Rash 02/23/2023   Latex Rash 04/10/2012    Medications: Outpatient Encounter Medications as of 01/19/2024  Medication Sig   albuterol (VENTOLIN HFA) 108 (90 Base) MCG/ACT inhaler Inhale into the lungs every 6 (six) hours as needed for wheezing or shortness of breath.   alendronate (FOSAMAX) 70 MG tablet Take 70 mg by mouth once a week.  Take with a full glass of water on an empty stomach.   amiodarone (PACERONE) 200 MG tablet Take 200 mg by mouth daily.   apixaban (ELIQUIS) 5 MG TABS tablet Take 5 mg by mouth 2 (two) times daily.   DULoxetine (CYMBALTA) 20 MG capsule Take 40 mg by mouth  daily.   fenofibrate 160 MG tablet Take 160 mg by mouth daily.   fluticasone (FLONASE) 50 MCG/ACT nasal spray Place 2 sprays into both nostrils daily as needed for allergies.   fluticasone-salmeterol (WIXELA INHUB) 250-50 MCG/ACT AEPB Inhale 1 puff into the lungs in the morning and at bedtime.   folic acid (FOLVITE) 1 MG tablet Take 1 mg by mouth daily.   furosemide (LASIX) 20 MG tablet Take 20 mg by mouth daily.   gabapentin (NEURONTIN) 300 MG capsule Take 300 mg by mouth 2 (two) times daily.   insulin glargine (LANTUS) 100 UNIT/ML Solostar Pen Inject 8 Units into the skin at bedtime. Pt takes once daily at different times depending on her glucose   levothyroxine (SYNTHROID) 50 MCG tablet Take 50 mcg by mouth daily before breakfast.   meloxicam (MOBIC) 15 MG tablet Take 15 mg by mouth daily as needed for pain.   methotrexate (RHEUMATREX) 2.5 MG tablet Take 10 mg by mouth once a week. Caution:Chemotherapy. Protect from light.   montelukast (SINGULAIR) 10 MG tablet Take 10 mg by mouth at bedtime.   mycophenolate (CELLCEPT) 250 MG capsule Take 250 mg by mouth 2 (two) times daily.   omeprazole (PRILOSEC) 20 MG capsule Take 20 mg by mouth daily.   oxyCODONE  (ROXICODONE ) 5 MG immediate release tablet Take 1-2 tablets (5-10 mg total) by mouth every 4 (four) hours as needed for moderate pain or severe pain.   potassium chloride SA (KLOR-CON) 20 MEQ tablet Take 20 mEq by mouth daily. (Patient not taking: Reported on 02/23/2023)   predniSONE (DELTASONE) 1 MG tablet Take 3 mg by mouth daily.   rosuvastatin (CRESTOR) 40 MG tablet Take 40 mg by mouth daily.   sotalol (BETAPACE) 80 MG tablet Take 80 mg by mouth 3 (three) times daily.   traMADol (ULTRAM) 50 MG tablet Take 50 mg by mouth every 6 (six) hours as needed.   valsartan (DIOVAN) 80 MG tablet Take 80 mg by mouth daily.   No facility-administered encounter medications on file as of 01/19/2024.    Social History: Social History   Tobacco Use    Smoking status: Never   Smokeless tobacco: Never  Vaping Use   Vaping status: Never Used  Substance Use Topics   Alcohol use: Never   Drug use: Never    Family Medical History: Family History  Problem Relation Age of Onset   Breast cancer Sister 69   Breast cancer Maternal Aunt 28   Breast cancer Cousin 49    Physical Examination: @VITALWITHPAIN @  General: Patient is well developed, well nourished, calm, collected, and in no apparent distress. Attention to examination is appropriate.  Psychiatric: Patient is non-anxious.  Head:  Pupils equal, round, and reactive to light.  ENT:  Oral mucosa appears well hydrated.  Neck:   Supple.  ***Full range of motion.  Respiratory: Patient is breathing without any difficulty.  Extremities: No edema.  Vascular: Palpable dorsal pedal pulses.  Skin:   On exposed skin, there are no abnormal skin lesions.  NEUROLOGICAL:     Awake, alert, oriented to person, place, and time.  Speech is clear and fluent. Fund of knowledge is appropriate.  Cranial Nerves: Pupils equal round and reactive to light.  Facial tone is symmetric.  Facial sensation is symmetric.  ROM of spine: ***full.  Palpation of spine: ***non tender.    Strength: Side Biceps Triceps Deltoid Interossei Grip Wrist Ext. Wrist Flex.  R 5 5 5 5 5 5 5   L 5 5 5 5 5 5 5    Side Iliopsoas Quads Hamstring PF DF EHL  R 5 5 5 5 5 5   L 5 5 5 5 5 5    Reflexes are ***2+ and symmetric at the biceps, triceps, brachioradialis, patella and achilles.   Hoffman's is absent.  Clonus is not present.  Toes are down-going.  Bilateral upper and lower extremity sensation is intact to light touch.    Gait is normal.   No difficulty with tandem gait.   No evidence of dysmetria noted.  Medical Decision Making  Imaging: ***  I have personally reviewed the images and agree with the above interpretation.  Assessment and Plan: Ms. Deisy Frick is a pleasant 78 y.o. female with  ***    Thank you for involving me in the care of this patient.   I spent a total of *** minutes in both face-to-face and non-face-to-face activities for this visit on the date of this encounter.   Ludwig Safer, PA-C Dept. of Neurosurgery

## 2024-01-19 ENCOUNTER — Ambulatory Visit: Admitting: Physician Assistant

## 2024-01-29 NOTE — Progress Notes (Deleted)
 Referring Physician:  Rex Castor, MD [address] [place],  [place] [address]  Primary Physician:  [facility]  History of Present Illness: 01/29/2024 [name] is here today with a chief complaint of ***  Back and right leg pain  Duration: *** Location: *** Quality: *** Severity: ***  Precipitating: aggravated by *** Modifying factors: made better by *** Weakness: none Timing: *** Bowel/Bladder Dysfunction: none  Conservative measures:  Physical therapy: *** has not participated in?? Multimodal medical therapy including regular antiinflammatories: *** cymbalta, gabapentin, meloxicam, tramadol Injections: *** no epidural steroid injections?  Past Surgery: ***no spinal surgeries  Janice Cook has ***no symptoms of cervical myelopathy.  The symptoms are causing a significant impact on the patient's life.   Review of Systems:  A 10 point review of systems is negative, except for the pertinent positives and negatives detailed in the HPI.  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   Lupus (HCC)    Osteoporosis    PAF (paroxysmal atrial fibrillation) (HCC)    PONV (postoperative nausea and vomiting)    T2DM (type 2 diabetes mellitus) (HCC)    Uses walker    Wears dentures    partial upper    Past Surgical History: Past Surgical History:  Procedure Laterality Date   CARDIAC CATHETERIZATION Left 05/24/2012   Normal coronaries; LVEF  60%; LVEDP 16; Location: [facility]; Surgeon: Stevie Elder, MD   CATARACT EXTRACTION W/PHACO Right 03/01/2023   Procedure: CATARACT EXTRACTION PHACO AND INTRAOCULAR LENS PLACEMENT (IOC) RIGHT MALYUGIN VISION BLUE DIABETIC 18.89 01:33.5;  Surgeon: Trudi Fus, MD;  Location: [facility];  Service: Ophthalmology;  Laterality: Right;  Latex Diabetic   CHOLECYSTECTOMY     SALPINGOOPHORECTOMY Left    age 51 in [place]   SHOULDER ARTHROSCOPY WITH SUBACROMIAL DECOMPRESSION, ROTATOR CUFF REPAIR AND BICEP TENDON REPAIR Right 05/11/2021   Procedure: Extensive arthroscopic debridement, arthroscopic subscapularis tendon repair, arthroscopic subacromial decompression, and mini-open repair of supraspinatus tendon tear, right shoulder;  Surgeon: Elner Hahn, MD;  Location: [facility];  Service: Orthopedics;  Laterality: Right;   SPLENECTOMY, TOTAL      Allergies: Allergies as of 01/31/2024 - Review Complete 03/01/2023  Allergen Reaction Noted   Aspirin Nausea And Vomiting 12/19/2019   Nitroglycerin Other (See Comments) 01/02/2020   Other Other (See Comments) 01/02/2020   Sulfa antibiotics Itching, Nausea And Vomiting, and Rash 12/19/2019   Lactose intolerance (gi) Nausea And Vomiting 02/23/2023   Meat extract Nausea And Vomiting 02/23/2023   Propofol  Nausea And Vomiting 01/02/2020   Hydroxyzine Swelling and Rash 02/23/2023   Latex Rash 04/10/2012    Medications: Outpatient Encounter Medications as of 01/31/2024  Medication Sig   albuterol (VENTOLIN HFA) 108 (90 Base) MCG/ACT inhaler Inhale into the lungs every 6 (six) hours as needed for wheezing or shortness of breath.   alendronate (FOSAMAX) 70 MG tablet Take 70 mg by mouth once a week.  Take with a full glass of water on an empty stomach.   amiodarone (PACERONE) 200 MG tablet Take 200 mg by mouth daily.   apixaban (ELIQUIS) 5 MG TABS tablet Take 5 mg by mouth 2 (two) times daily.   DULoxetine (CYMBALTA) 20 MG capsule Take 40 mg by mouth  daily.   fenofibrate 160 MG tablet Take 160 mg by mouth daily.   fluticasone (FLONASE) 50 MCG/ACT nasal spray Place 2 sprays into both nostrils daily as needed for allergies.   fluticasone-salmeterol (WIXELA INHUB) 250-50 MCG/ACT AEPB Inhale 1 puff into the lungs in the morning and at bedtime.   folic acid (FOLVITE) 1 MG tablet Take 1 mg by mouth daily.   furosemide (LASIX) 20 MG tablet Take 20 mg by mouth daily.   gabapentin (NEURONTIN) 300 MG capsule Take 300 mg by mouth 2 (two) times daily.   insulin glargine (LANTUS) 100 UNIT/ML Solostar Pen Inject 8 Units into the skin at bedtime. Pt takes once daily at different times depending on her glucose   levothyroxine (SYNTHROID) 50 MCG tablet Take 50 mcg by mouth daily before breakfast.   meloxicam (MOBIC) 15 MG tablet Take 15 mg by mouth daily as needed for pain.   methotrexate (RHEUMATREX) 2.5 MG tablet Take 10 mg by mouth once a week. Caution:Chemotherapy. Protect from light.   montelukast (SINGULAIR) 10 MG tablet Take 10 mg by mouth at bedtime.   mycophenolate (CELLCEPT) 250 MG capsule Take 250 mg by mouth 2 (two) times daily.   omeprazole (PRILOSEC) 20 MG capsule Take 20 mg by mouth daily.   oxyCODONE  (ROXICODONE ) 5 MG immediate release tablet Take 1-2 tablets (5-10 mg total) by mouth every 4 (four) hours as needed for moderate pain or severe pain.   potassium chloride SA (KLOR-CON) 20 MEQ tablet Take 20 mEq by mouth daily. (Patient not taking: Reported on 02/23/2023)   predniSONE (DELTASONE) 1 MG tablet Take 3 mg by mouth daily.   rosuvastatin (CRESTOR) 40 MG tablet Take 40 mg by mouth daily.   sotalol (BETAPACE) 80 MG tablet Take 80 mg by mouth 3 (three) times daily.   traMADol (ULTRAM) 50 MG tablet Take 50 mg by mouth every 6 (six) hours as needed.   valsartan (DIOVAN) 80 MG tablet Take 80 mg by mouth daily.   No facility-administered encounter medications on file as of 01/31/2024.    Social History: Social History   Tobacco Use    Smoking status: Never   Smokeless tobacco: Never  Vaping Use   Vaping status: Never Used  Substance Use Topics   Alcohol use: Never   Drug use: Never    Family Medical History: Family History  Problem Relation Age of Onset   Breast cancer Sister 42   Breast cancer Maternal Aunt 17   Breast cancer Cousin 58    Physical Examination: @VITALWITHPAIN @  General: Patient is well developed, well nourished, calm, collected, and in no apparent distress. Attention to examination is appropriate.  Psychiatric: Patient is non-anxious.  Head:  Pupils equal, round, and reactive to light.  ENT:  Oral mucosa appears well hydrated.  Neck:   Supple.  ***Full range of motion.  Respiratory: Patient is breathing without any difficulty.  Extremities: No edema.  Vascular: Palpable dorsal pedal pulses.  Skin:   On exposed skin, there are no abnormal skin lesions.  NEUROLOGICAL:     Awake, alert, oriented to person, place, and time.  Speech is clear and fluent. Fund of knowledge is appropriate.  Cranial Nerves: Pupils equal round and reactive to light.  Facial tone is symmetric.  Facial sensation is symmetric.  ROM of spine: ***full.  Palpation of spine: ***non tender.    Strength: Side Biceps Triceps Deltoid Interossei Grip Wrist Ext. Wrist Flex.  R 5 5 5 5 5 5 5   L 5 5 5 5 5 5 5    Side Iliopsoas Quads Hamstring PF DF EHL  R 5 5 5 5 5 5   L 5 5 5 5 5 5    Reflexes are ***2+ and symmetric at the biceps, triceps, brachioradialis, patella and achilles.   Hoffman's is absent.  Clonus is not present.  Toes are down-going.  Bilateral upper and lower extremity sensation is intact to light touch.    Gait is normal.   No difficulty with tandem gait.   No evidence of dysmetria noted.  Medical Decision Making  Imaging: ***  I have personally reviewed the images and agree with the above interpretation.  Assessment and Plan: [name] is a pleasant 78 y.o. female with  ***    Thank you for involving me in the care of this patient.   I spent a total of *** minutes in both face-to-face and non-face-to-face activities for this visit on the date of this encounter.   Ludwig Safer, PA-C Dept. of Neurosurgery

## 2024-01-31 ENCOUNTER — Ambulatory Visit: Admitting: Physician Assistant

## 2024-02-19 ENCOUNTER — Ambulatory Visit: Admitting: Physician Assistant
